# Patient Record
Sex: Male | Born: 1960 | Race: White | Hispanic: No | Marital: Single | Smoking: Never smoker
Health system: Southern US, Community
[De-identification: ages and names within clinical notes are randomized; demographics above are authoritative.]

## PROBLEM LIST (undated history)

## (undated) DIAGNOSIS — R1310 Dysphagia, unspecified: Secondary | ICD-10-CM

## (undated) DIAGNOSIS — Q909 Down syndrome, unspecified: Secondary | ICD-10-CM

## (undated) DIAGNOSIS — K219 Gastro-esophageal reflux disease without esophagitis: Secondary | ICD-10-CM

## (undated) DIAGNOSIS — K2210 Ulcer of esophagus without bleeding: Secondary | ICD-10-CM

## (undated) HISTORY — PX: NO PAST SURGERIES: SHX2092

---

## 2005-09-17 ENCOUNTER — Encounter: Admission: RE | Admit: 2005-09-17 | Discharge: 2005-09-17 | Payer: Self-pay | Admitting: Family Medicine

## 2006-08-08 ENCOUNTER — Inpatient Hospital Stay (HOSPITAL_COMMUNITY): Admission: EM | Admit: 2006-08-08 | Discharge: 2006-08-12 | Payer: Self-pay | Admitting: Emergency Medicine

## 2006-08-10 ENCOUNTER — Encounter (INDEPENDENT_AMBULATORY_CARE_PROVIDER_SITE_OTHER): Payer: Self-pay | Admitting: *Deleted

## 2006-08-10 DIAGNOSIS — K2210 Ulcer of esophagus without bleeding: Secondary | ICD-10-CM

## 2006-08-10 DIAGNOSIS — R1310 Dysphagia, unspecified: Secondary | ICD-10-CM

## 2006-08-10 HISTORY — DX: Dysphagia, unspecified: R13.10

## 2006-08-10 HISTORY — DX: Ulcer of esophagus without bleeding: K22.10

## 2008-11-11 ENCOUNTER — Encounter: Admission: RE | Admit: 2008-11-11 | Discharge: 2008-11-11 | Payer: Self-pay | Admitting: Family Medicine

## 2009-06-29 ENCOUNTER — Encounter: Admission: RE | Admit: 2009-06-29 | Discharge: 2009-06-29 | Payer: Self-pay | Admitting: Family Medicine

## 2010-10-05 NOTE — Consult Note (Signed)
[name], [name]           ACCOUNT NO.:  [id number]   MEDICAL RECORD NO.:  [id number]          PATIENT TYPE:  INP   LOCATION:  1401                         FACILITY:  [facility]   PHYSICIAN:  [name] Contras, MD     DATE OF BIRTH:  Feb 01, 1961   DATE OF CONSULTATION:  08/09/2006  DATE OF DISCHARGE:                                 CONSULTATION   REQUESTING SERVICE:  In [facility].   CHIEF COMPLAINT:  Vomiting with poor oral intake.   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  He had poor oral intake through the week and  his vomiting was described as projectile and regurgitating.  With any  attempt to eat or drink, he vomits it back up immediately.  He had some  low grade fever and chills about 3 days ago.  He has been biting his  arms more over the past week, which is typically a sign of pain.  Because of these symptoms, he was brought to the emergency room on August 07, 2006, where he was sedated for examination and testing.  He ended up  being overly sedated but did not require intubation.  Testing included a  CT scan of the head which was negative and a chest x-ray which showed  findings consistent with pneumonitis.  Abdominal x-ray was negative.  Lab testing included normal electrolytes and a normal white blood count.  Since being admitted, he has had no significant oral intake except  trying to sip a couple of sips of Sprite which he regurgitated.  His IV  has been pulled out and consultation was requested with an unclear  diagnosis at this time.   PAST MEDICAL HISTORY:  1. Down syndrome.  2. Mental retardation.  3. Constipation.  4. Allergies.  5. Dysphagia.  6. History of self mutilation.   MEDICATIONS:  Singulair, Constulose, Phenergan.   ALLERGIES:  1. PENICILLIN.  2. ACTIFED.  3. KEFLEX.   FAMILY HISTORY:  The patient's mother is 48 years  old and has Alzheimer  dementia.  His father is 15 years old and has bladder cancer.  He has 3  sisters who are relatively healthy except for one who has hypertension  and obesity.   SOCIAL HISTORY:  The patient lives as a resident at [facility], which is a group home.  He is predominantly dependent for most  of his ADLs.  He does not smoke and does not drink alcohol.  Evidently,  he is a fast eater.   REVIEW OF SYSTEMS:  Cannot be obtained because of mental status.   PHYSICAL EXAMINATION:  VITAL SIGNS:  Afebrile, vital signs stable.  GENERAL:  The patient is alert but not very cooperative.  ABILITY TO COMMUNICATE:  The patient is nonverbal and grunts at times.  HEAD/FACE:  The face is a Down facies.  There are no structural  abnormalities of the face and the head otherwise.  EYES:  Extraocular movements appear to be intact.  The pupils are  equally round.  NOSE:  External nose is normal.  The nasal passages are patent.  MOUTH:  The patient has poor dentition.  His mouth is examined with his  sisters holding him.  The tongue appears to be normal.  The oropharynx  shows no evidence of exudate or redness.  There is no swelling in the  oropharynx.  NECK:  The patient's neck is muscular.  There is no tenderness and no  mass.  LYMPHATICS:  There is no lymphadenopathy of the neck.  CRANIAL NERVES:  Difficult to assess due to his mental status.   RADIOLOGIC EXAMINATION:  A head CT on August 07, 2006, was personally  reviewed and showed normal sinuses.   ASSESSMENT:  The patient is a 50 year old white male with mental  retardation who has had a several day history of vomiting,  regurgitation, and poor oral intake.   PLAN:  The neck and oropharynx appear to be normal and the sinuses are  normal by CT.  It is possible that he has an esophageal obstruction due  to a food bolus.  Other esophageal conditions are possible as well.  A  rigid esophagoscopy would be risky given his  history of Down syndrome  and potential for cervical spine instability.  Thus, I discussed the  case with Dr. Evette Cristal who is a gastroenterologist, who has agreed to see  the patient for consideration of flexible endoscopy.  I will recommend  that an IV be started on the patient, so that he can be hydrated in the  meantime.      [name] Contras, MD  Electronically Signed     DDB/MEDQ  D:  08/09/2006  T:  08/09/2006  Job:  [id number]

## 2010-10-05 NOTE — Consult Note (Signed)
NAMELAVONTA, [name] NO.:  [id number]   MEDICAL RECORD NO.:  [id number]          PATIENT TYPE:  INP   LOCATION:  1401                         FACILITY:  [facility]   PHYSICIAN:  Graylin Shiver, M.D.   DATE OF BIRTH:  1961/03/12   DATE OF CONSULTATION:  08/09/2006  DATE OF DISCHARGE:                                 CONSULTATION   REASON FOR CONSULTATION:  The patient is a 50 year old white male with a  history of Down syndrome.  He was admitted to the hospital from the  group home that he lives in on August 07, 2006, because of persistent  vomiting.  The patient has been vomiting for about a week and has been  unable to keep anything down.  There is no history of hematemesis while  in the hospital.  His sister has been trying to give him something to  drink periodically.  He can drink some water; sometimes it stays down  for 30 minutes, but then comes back up; or some times it comes up  immediately.  Because of his persistent vomiting and refusal at this  time to put anything in his mouth, ENT was asked to see him.  Dr. Jenne Pane  came and saw him.  He did not find any abnormality in the oropharynx to  explain this problem.  We are asked, therefore, to further evaluate the  esophagus and upper GI tract for a possible source.   In reviewing his history, he was given sedation for a CT scan in the  emergency room when he came in.  He became extremely obtunded after the  sedation, with dropping of his oxygen saturations.   PAST HISTORY:  1. Down syndrome with profound mental retardation.  2. History of self-mutilation.   ALLERGIES:  PENICILLIN, ACTIFED, KEFLEX.   MEDICATIONS PRIOR TO ADMISSION:  1. Singulair.  2. __________  for constipation.  3. Phenergan.   SYSTEMS REVIEW:  Unobtainable from the patient.  The patient essentially  cannot give any history.   PHYSICAL EXAMINATION:  GENERAL:  He does not appear in any acute  distress.  HEENT:  He is nonicteric.  HEART:  Regular rhythm.  No murmurs heard.  LUNGS:  Clear.  ABDOMEN:  Soft, nontender.   IMPRESSION:  Persistent vomiting, which has being going on for about a  week.  There is also a question of dysphagia.  I wonder about also  odynophagia, since now he is  refusing to put anything in his mouth.   PLAN:  In light of his symptoms, he could have esophagitis, stricture or  even a foreign body in the esophagus.  I will plan to proceed with upper  endoscopy.  I will ask anesthesia for further assistance in sedation.           ______________________________  Graylin Shiver, M.D.     SFG/MEDQ  D:  08/09/2006  T:  08/09/2006  Job:  [id number]   cc:   Altha Harm, MD  Fax: [phone]   Antony Contras, MD  Fax: [phone]

## 2010-10-05 NOTE — H&P (Signed)
[name], [name]           ACCOUNT NO.:  [id number]   MEDICAL RECORD NO.:  [id number]          PATIENT TYPE:  INP   LOCATION:  1401                         FACILITY:  [facility]   PHYSICIAN:  Elliot Cousin, M.D.    DATE OF BIRTH:  [birth year]   DATE OF ADMISSION:  08/07/2006  DATE OF DISCHARGE:                              HISTORY & PHYSICAL   CHIEF COMPLAINT:  Intractable nausea and vomiting.   HISTORY OF PRESENT ILLNESS:  The patient is a 50 year old man with a  past medical history significant for Downs syndrome and profound mental  retardation.  He is a resident of [facility], a group home.  He was brought to the emergency department at [facility] late  last night for intractable nausea and vomiting.  The patient is  currently sedated and is unable to provide any history.  Because of his  profound mental retardation, he is virtually non verbal chronically.  The history is provided by an assistant at the group home, Gene Swaziland,  and the patient's sister, [name].  Accordingly, over the  past 3-4 days, the patient has had intractable nausea and vomiting.  At  times he has had projective vomiting and regurgitation.  According to  Mr. Swaziland, over the past 24 hours the patient has been unable to keep  any foods and liquids down at all.  As soon as he attempts to eat or  drink, he vomits them back up immediately.  The patient has also had  some chest congestion and a productive and intermittently dry cough.  When his cough is productive, the sputum has been white and occasionally  off-colored.  There was an episode of fevers and chills 3 days ago.  It  is unclear whether or not the patient has had abdominal pain.  However,  according to Mr. Swaziland and Ms. Ramp, when the patient has any type  of discomfort he self mutilates by biting his arm and also by thrashing  his head against the bed or a wall.  He has had some episodes of biting  of his  right greater than his left arms over the past few days.  He has  not had diarrhea.  No obvious pain with urination, although Mr. Swaziland  notes that his urine output has been very little over the past 2 days.  He has had no coffee ground emesis or bright red blood in his emesis.  No bright red blood per rectum and no black, tarry stools.   During the initial evaluation in the emergency department by the  physician and PA, the patient was noted to be very agitated and  obtaining lab work, CT scan of the head, and a chest x-ray were  virtually impossible.  The patient was therefore given a total of 20 mg  of Geodon IM, 2 mg of Dilaudid, 4 mg of Zofran, and 2 mg of Ativan.  These medications were given over the course of 2 hours.  The patient  eventually became sedated and the evaluation was able to go forth.  However, following the administration of the  medications, the patient  became virtually obtunded.  The oxygen saturation transiently fell into  the 50s to 80s on 2 liters of nasal cannula oxygen.  The oxygen  supplementation was titrated upward.  Now several hours later, the  patient is oxygenating between 95 and 98% on 2 liters of nasal cannula  oxygen.  He is currently hemodynamically stable, although he is still  somewhat obtunded.  The patient is protecting his airway.  He will be  admitted for further evaluation and management.   PAST MEDICAL HISTORY:  1. Downs syndrome with profound mental retardation.  2. History of self-mutilation.  3. Constipation.  4. Seasonal allergies.  5. Mild dysphagia.  6. MEDICATION ALLERGIES TO PENICILLIN, ACTIFED, AND KEFLEX.   MEDICATIONS:  1. Singulair 10 mg daily.  2. Constulose 10 gm/15 mL, 30 mL b.i.d.  3. Phenergan 25 mg IM q.6 h p.r.n. nausea and vomiting.  4. Regular mechanical chopped food, staff to assist as needed.   ALLERGIES:  THE PATIENT HAS ALLERGIES TO KEFLEX, ACTIFED, AND  PENICILLIN.   SOCIAL HISTORY:  The patient is a  resident of [facility]  (group home).  He is single with no children.  He is predominantly  dependent on most of his ADL's.  He needs a small amount of assistance  with eating.  He ambulates only with assistance.  No history of alcohol,  tobacco or illicit drug use.  His sister, Dale Ribeiro, is his legal  guardian.   FAMILY HISTORY:  His mother is a 10 years of age and has Alzheimer's  dementia.  His father is 38 years of age and has bladder cancer.  He has  3 sisters who are relatively healthy with the exception of one who has  hypertension and obesity.   REVIEW OF SYSTEMS:  As above in the History of Present Illness.   EXAM:  VITAL SIGNS: Temperature 98.4, blood pressure 103/69, pulse 120,  oxygen saturation 96% on 2 liters of nasal cannula oxygen.  Respiratory  rate 16.  IN GENERAL: The patient is a small framed 50 year old Caucasian man who  is currently lying in bed obtunded.  HEENT: His head is normocephalic.  There is a small area of ecchymosis  and a small nodule over the right temple which is mildly erythematous  with no obvious tenderness (according to Ms. Cowdrey this is the area  that the patient thrashes his head against the wall or bed).  Pupils are  equal and round and minimally reactive to light.  Extraocular movements  are intact.  Conjunctivae are clear.  Sclerae are white.  Nasal mucosa  is dry, no sinus tenderness.  Oropharynx reveals a few missing teeth and  fair dentition.  Mucous membranes are dry.  No posterior exudates or  erythema.  NECK: Supple, no adenopathy, no thyromegaly, no bruit, no JVD.  LUNGS: A few bilateral crackles and occasional wheezes are auscultated  bilaterally with decreased breath sounds in the bases.  Breathing  appears to be nonlabored.  HEART: S1/S2 with a soft systolic murmur and tachycardia.  ABDOMEN: Hypoactive bowel sounds, soft, nontender, nondistended, no hepatosplenomegaly, no masses palpated.  GU AND RECTAL:  Deferred.  EXTREMITIES: Pedal pulses palpable bilaterally.  No pedal edema and no  pretibial edema.  His right arm has multiple bite marks as evidenced by  excoriations; no active bleeding, purulent drainage but with a small  amount of erythema.  NEUROLOGIC: The patient is obtunded and minimally responsive to stimuli.  According  to the registered nurse the patient was moving all of his  extremities and alert prior to medications given.   ADMISSION LABORATORIES:  Chest x-ray reveals bronchitis versus  interstitial pneumonitis.  CT scan of the head reveals no acute  intracranial findings.  Abdominal x-ray reveals no acute abdominal  process.  Myoglobin 318, CK-MB 8.2, troponin-I less than 0.05, sodium  147, potassium 3.7, chloride 110, carbon dioxide 19, glucose 108, BUN  15, creatinine 1.22, calcium 9.2, total protein 7.7, albumin 3.7, AST  26, ALT 17, lipase 21.  Urinalysis moderate bilirubin, greater than 80  ketones, 30 protein, negative leukocyte esterase.   ASSESSMENT:  1. Intractable nausea and vomiting.  The etiology is unclear.      However, the patient may have a viral versus bacterial      gastroenteritis.  During his stay in the emergency department,      there was no evidence of nausea and vomiting.  Of note, his lipase      and LFTs are within normal limits.  His urinalysis is not      consistent with pyelonephritis or an urinary tract infection.  2. Reported agitation in the emergency department.  Now the patient      has obtundation status post administration of Geodon, Dilaudid,      Zofran, and Ativan.  3. Transient hypoxia secondary to mild respiratory depression.  The      patient is now saturating between 95 and 98% on 2 liters of nasal      cannula oxygen and he has been doing so for the past 1-2 hours.  He      is currently protecting his airway.  4. Bronchitis versus pneumonitis on the chest x-ray.  Given the      patient's medical history, he may have an  element of aspiration      pneumonitis.  He is however afebrile and without a leukocytosis.      Given his recent history, however, it is reasonable to start      empiric antibiotics.  5. Dehydration/hypernatremia.  The patient's serum sodium is 147.      This is consistent with recent nausea and vomiting and poor p.o.      intake.   PLAN:  1. The patient will be admitted for further evaluation and management.  2. Continue oxygen supplementation titrated to keep his oxygen      saturations greater than 90%.  3. Neuro checks every 4 hours x24 hours.  4. IV fluid volume repletion with normal saline.  5. Cleocin was given by the emergency department physician.  Will      continue antibiotic treatment with Levaquin 500 mg IV daily.  6. Pulmonary toilet per the respiratory therapist.  Will start Xopenex      nebulizer every 6 hours, then every 2 hours p.r.n. 7. We will check an ultrasound of the abdomen for further evaluation      of nausea and vomiting.  8. The patient is currently a full code per Ms. Greggory Brandy.      Elliot Cousin, M.D.  Electronically Signed     DF/MEDQ  D:  08/08/2006  T:  08/08/2006  Job:  [id number]

## 2010-10-05 NOTE — Discharge Summary (Signed)
[name], [name]           ACCOUNT NO.:  [id number]   MEDICAL RECORD NO.:  [id number]          PATIENT TYPE:  INP   LOCATION:  1401                         [facility]ILITY[facility]   PHYSICIAN:  Altha Harm, MDDATE OF BIRTH:  01/23/1961   DATE OF ADMISSION:  08/07/2006  DATE OF DISCHARGE:  08/12/2006                               DISCHARGE SUMMARY   DISCHARGE DISPOSITION:  Home.   FINAL DISCHARGE DIAGNOSES:  1. Esophageal ulcers.  2. Mild dehydration.  3. History of dysphagia.  4. History of allergic rhinitis.  5. History of self-mutilation.  6. Down syndrome with profound mental retardation.  7. History of constipation.   CONSULTANTS:  Gastroenterology.   PROCEDURE:  EGD which showed esophageal ulcers.   CODE STATUS:  Full code.   ALLERGIES:  PENICILLIN, AMPICILLIN, KEFLEX.   DISCHARGE MEDICATIONS:  1. Protonix 40 mg p.o. daily.  2. Carafate 1 g p.o. a.c. and h.s.  3. Singulair 10 mg p.o. daily.  4. Phenergan 25 mg p.o. q.6h. p.r.n.  5. Lactulose 30 mL p.o. b.i.d.  6. Halcion 0.2 mg p.o. prior to dental work.   CHIEF COMPLAINT:  Refusal to eat and spitting of food.   HISTORY OF PRESENT ILLNESS:  Please see H&P dictated by Dr. Sherrie Mustache for  details of the HPI.   HOSPITAL COURSE:  Esophageal ulcers and poor oral intake.  The patient  was observed for approximately 24 hours while on IV fluids, and it was  noted that the patient was in fact not having any emesis but rather was  spitting up any food offered to him and had refused to take anything  orally.  The patient is nonverbal and cannot verbally express his pain  but indications by family members and group home personnel who knew him  best suggested that this was an indication that the patient was having  pain.  Consultation was made to ENT for him to have an examination of  the throat.  However, this examination did not reveal any pathology, and  thus gastroenterology was consulted, and the patient had an  EGD done  that showed esophageal ulcers.  The patient was placed on Protonix  b.i.d. and, after 24 hours of being on the IV Protonix, the patient  began to accept food.  The patient, at this time, has been ingesting  some liquids and, in particular, ice-cream.  The patient has taken in  enough fluid to be able to maintain his hydration.  The patient should  be on a low fat diet, avoiding fried foods and no spicy foods.  I would  initially recommend a soft diet for the patient until he is not showing  any indications of pain and then advance the diet to what he regularly  eats.  All other medical problems remained stable while hospitalized,  and the patient is being discharged on the above-stated medications.   FOLLOWUP:  The patient is to follow up in the gastroenterology clinic in  2-3 weeks with Dr. Bosie [name], phone number [phone].   PHYSICAL RESTRICTIONS:  None.   LABORATORY FOLLOWUP:  None.      [name]  [name] Garnet, MD  Electronically Signed     MAM/MEDQ  D:  08/12/2006  T:  08/12/2006  Job:  [id number]   cc:   [name] Friar, MD  Fax: [phone]

## 2010-10-05 NOTE — Op Note (Signed)
[name], [name]           ACCOUNT NO.:  [id number]   MEDICAL RECORD NO.:  [id number]          PATIENT TYPE:  INP   LOCATION:  1401                         FACILITY:  [facility]   PHYSICIAN:  Graylin Shiver, M.D.   DATE OF BIRTH:  [birth year]   DATE OF PROCEDURE:  08/10/2006  DATE OF DISCHARGE:                               OPERATIVE REPORT   OPERATION/PROCEDURE:  Esophagogastroduodenoscopy.   INDICATIONS FOR PROCEDURE:  This is a 50 year old patient with Down  syndrome, unable to swallow.  He used to eat without difficulty and  drink without difficulty, but all of a sudden started to refuse to eat  or drink.  The family felt that it was because he was having pain and  difficulty swallowing.  Informed consent was obtained after explanation of the risks of  bleeding, infection and perforation from the family.   PROCEDURE:  The patient was anesthetized by the anesthesiologist.   With the patient in the left lateral decubitus position, the gastroscope  was inserted into the oropharynx and passed into the esophagus.  It was  advanced down the esophagus,  then into the stomach, then into the  duodenum.  The second portion and bulb of the duodenum looked normal.  The stomach looked normal.  There was a linear ulcer at 35 cm in the  esophagus and two ulcers at 32 cm in the esophagus.  These were  biopsied.  There was some exudate in the esophagus.  I am not sure pf  this was candida or just some mucous.  He tolerated the procedure well  without complications.   IMPRESSION:  1. Linear ulcer in the esophagus at 35 cm.  2. Two ulcers at 32 cm in the esophagus.  3. Exudate in the esophagus, rule out Candida.   PLAN:  Check biopsies.           ______________________________  Graylin Shiver, M.D.     SFG/MEDQ  D:  08/11/2006  T:  08/12/2006  Job:  [id number]   cc:   [facility]

## 2014-07-30 ENCOUNTER — Emergency Department (HOSPITAL_COMMUNITY)
Admission: EM | Admit: 2014-07-30 | Discharge: 2014-07-30 | Disposition: A | Discharge status: Home / Self Care | Payer: Medicare Other | Attending: Emergency Medicine | Admitting: Emergency Medicine

## 2014-07-30 ENCOUNTER — Encounter (HOSPITAL_COMMUNITY): Payer: Self-pay | Admitting: Emergency Medicine

## 2014-07-30 DIAGNOSIS — Q909 Down syndrome, unspecified: Secondary | ICD-10-CM | POA: Insufficient documentation

## 2014-07-30 DIAGNOSIS — Z79899 Other long term (current) drug therapy: Secondary | ICD-10-CM | POA: Diagnosis not present

## 2014-07-30 DIAGNOSIS — K2971 Gastritis, unspecified, with bleeding: Secondary | ICD-10-CM | POA: Insufficient documentation

## 2014-07-30 DIAGNOSIS — K921 Melena: Secondary | ICD-10-CM | POA: Diagnosis present

## 2014-07-30 DIAGNOSIS — Z880 Allergy status to penicillin: Secondary | ICD-10-CM | POA: Diagnosis not present

## 2014-07-30 DIAGNOSIS — K922 Gastrointestinal hemorrhage, unspecified: Secondary | ICD-10-CM

## 2014-07-30 HISTORY — DX: Down syndrome, unspecified: Q90.9

## 2014-07-30 HISTORY — DX: Dysphagia, unspecified: R13.10

## 2014-07-30 HISTORY — DX: Ulcer of esophagus without bleeding: K22.10

## 2014-07-30 LAB — BASIC METABOLIC PANEL
ANION GAP: 9 (ref 5–15)
BUN: 9 mg/dL (ref 6–23)
CHLORIDE: 107 mmol/L (ref 96–112)
CO2: 23 mmol/L (ref 19–32)
Calcium: 8.3 mg/dL — ABNORMAL LOW (ref 8.4–10.5)
Creatinine, Ser: 0.76 mg/dL (ref 0.50–1.35)
GFR calc Af Amer: >90 mL/min (ref >90)
Glucose, Bld: 90 mg/dL (ref 70–99)
POTASSIUM: 4.1 mmol/L (ref 3.5–5.1)
SODIUM: 139 mmol/L (ref 135–145)

## 2014-07-30 LAB — CBC WITH DIFFERENTIAL/PLATELET
BASOS PCT: 1 % (ref 0–1)
Basophils Absolute: 0.1 10*3/uL (ref 0.0–0.1)
EOS ABS: 0.1 10*3/uL (ref 0.0–0.7)
EOS PCT: 2 % (ref 0–5)
HEMATOCRIT: 38 % — AB (ref 39.0–52.0)
HEMOGLOBIN: 12.7 g/dL — AB (ref 13.0–17.0)
LYMPHS ABS: 1.3 10*3/uL (ref 0.7–4.0)
LYMPHS PCT: 16 % (ref 12–46)
MCH: 32.7 pg (ref 26.0–34.0)
MCHC: 33.4 g/dL (ref 30.0–36.0)
MCV: 97.9 fL (ref 78.0–100.0)
MONO ABS: 1 10*3/uL (ref 0.1–1.0)
MONOS PCT: 12 % (ref 3–12)
NEUTROS PCT: 69 % (ref 43–77)
Neutro Abs: 5.7 10*3/uL (ref 1.7–7.7)
PLATELETS: 290 10*3/uL (ref 150–400)
RBC: 3.88 MIL/uL — AB (ref 4.22–5.81)
RDW: 13.2 % (ref 11.5–15.5)
WBC: 8.2 10*3/uL (ref 4.0–10.5)

## 2014-07-30 LAB — POC OCCULT BLOOD, ED: Fecal Occult Bld: POSITIVE — AB

## 2014-07-30 NOTE — ED Provider Notes (Signed)
CSN: [id number]     Arrival date & time 07/30/14  1718 History   First MD Initiated Contact with Patient 07/30/14 1740     Chief Complaint  Patient presents with  . decreased appetite   . Melena  . Malodorous urine       HPI Pt brought here by group home caregiver. Was seen by MD Katina Dung and referred here. Per group home patient has had strange foul odor in urine x3 days. Had black tarry stool starting today. Per home, patient has not been eating or drinking fluids-only cranberry juice and no water. MD requests UA with C&S, would like fecal occult blood completed and labs drawn to rule out possible GI bleed. No other c/c. Patient is rolling around on the floor and grunting. Group home manager says this is his baseline. Past Medical History  Diagnosis Date  . Down syndrome   . Esophageal ulcer   . Dysphagia    History reviewed. No pertinent past surgical history. History reviewed. No pertinent family history. History  Substance Use Topics  . Smoking status: Never Smoker   . Smokeless tobacco: Not on file  . Alcohol Use: No    Review of Systems  Unable to perform ROS     Allergies  Actifed cold-allergy; Amoxicillin; Keflex; and Penicillins  Home Medications   Prior to Admission medications   Medication Sig Start Date End Date Taking? Authorizing Provider  montelukast (SINGULAIR) 10 MG tablet Take 10 mg by mouth daily.   Yes Historical Provider, MD  pantoprazole (PROTONIX) 20 MG tablet Take 20 mg by mouth daily.   Yes Historical Provider, MD  triazolam (HALCION) 0.125 MG tablet Take 0.375 mg by mouth at bedtime as needed for sleep (prior to dental procedure).   Yes Historical Provider, MD   BP 139/70 mmHg  Pulse 79  Temp(Src) 97.9 F (36.6 C) (Axillary)  Resp 18  SpO2 95% Physical Exam  Constitutional: He appears well-nourished. No distress.  HENT:  Head: Normocephalic and atraumatic.  Eyes: Conjunctivae are normal.  Neck: Normal range of motion.   Cardiovascular: Normal rate.   Pulmonary/Chest: No respiratory distress.  Abdominal: Normal appearance. He exhibits no distension.  Neurological: He is alert. No cranial nerve deficit.  Skin: Skin is warm and dry. No rash noted.  Nursing note and vitals reviewed.   ED Course  Procedures (including critical care time) Labs Review Labs Reviewed  CBC WITH DIFFERENTIAL/PLATELET - Abnormal; Notable for the following:    RBC 3.88 (*)    Hemoglobin 12.7 (*)    HCT 38.0 (*)    All other components within normal limits  BASIC METABOLIC PANEL - Abnormal; Notable for the following:    Calcium 8.3 (*)    All other components within normal limits  POC OCCULT BLOOD, ED - Abnormal; Notable for the following:    Fecal Occult Bld POSITIVE (*)    All other components within normal limits  URINE CULTURE  URINALYSIS, ROUTINE W REFLEX MICROSCOPIC    Imaging Review No results found.  I discussed the case with Dr. Leary Roca from gastroenterology.  Patient appears stable for discharge and follow-up can be arranged next week through the office.  I spoke with Dr. Dayna Barker who was the physician at the group home.  I discussed to him my conversation with Dr. Ewing Schlein and he will follow-up next week as directed..  They can bring a urine back for further evaluation.  Patient stable for discharge.  MDM   Final  diagnoses:  Gastrointestinal hemorrhage, unspecified gastritis, unspecified gastrointestinal hemorrhage type     Nelva Nayobert Carie Kapuscinski, MD 07/30/14 2015

## 2014-07-30 NOTE — ED Notes (Signed)
Attending does not wish to do a cath for UA.

## 2014-07-30 NOTE — ED Notes (Signed)
Gave caregiver a urinal for the patient to see if he could give a urine sample.

## 2014-07-30 NOTE — ED Notes (Signed)
Pt brought here by group home caregiver. Was seen by MD Katina DungHoover [name] and referred here. Per group home patient has had strange foul odor in urine x3 days. Had black tarry stool starting today. Per home, patient has not been eating or drinking fluids-only cranberry juice and no water. MD requests UA with C&S, would like fecal occult blood completed and labs drawn to rule out possible GI bleed. No other c/c. Patient is rolling around on the floor and grunting. Group home manager says this is his baseline.

## 2014-07-30 NOTE — ED Notes (Signed)
Pt is sitting comfortably on the floor in NAD.  Pt's caregiver relays to this writer that pt, "just wants to go home."

## 2014-07-30 NOTE — ED Notes (Addendum)
Pt was unable to give a urine sample with help of the caregiver. Will notify RN.

## 2014-07-30 NOTE — ED Notes (Signed)
Cup of Apple juice given.

## 2014-07-30 NOTE — ED Notes (Signed)
Phlebotomy at bedside.

## 2014-07-30 NOTE — Discharge Instructions (Signed)

## 2014-08-03 ENCOUNTER — Emergency Department (HOSPITAL_COMMUNITY): Payer: Medicare Other

## 2014-08-03 ENCOUNTER — Emergency Department (HOSPITAL_COMMUNITY)
Admission: EM | Admit: 2014-08-03 | Discharge: 2014-08-03 | Disposition: A | Discharge status: Home / Self Care | Payer: Medicare Other | Attending: Emergency Medicine | Admitting: Emergency Medicine

## 2014-08-03 ENCOUNTER — Encounter (HOSPITAL_COMMUNITY): Payer: Self-pay | Admitting: *Deleted

## 2014-08-03 DIAGNOSIS — Z880 Allergy status to penicillin: Secondary | ICD-10-CM | POA: Diagnosis not present

## 2014-08-03 DIAGNOSIS — Q909 Down syndrome, unspecified: Secondary | ICD-10-CM | POA: Insufficient documentation

## 2014-08-03 DIAGNOSIS — Z8719 Personal history of other diseases of the digestive system: Secondary | ICD-10-CM | POA: Diagnosis not present

## 2014-08-03 DIAGNOSIS — R109 Unspecified abdominal pain: Secondary | ICD-10-CM | POA: Diagnosis not present

## 2014-08-03 DIAGNOSIS — M79604 Pain in right leg: Secondary | ICD-10-CM | POA: Diagnosis present

## 2014-08-03 DIAGNOSIS — Z79899 Other long term (current) drug therapy: Secondary | ICD-10-CM | POA: Diagnosis not present

## 2014-08-03 LAB — CBC WITH DIFFERENTIAL/PLATELET
BASOS ABS: 0.1 10*3/uL (ref 0.0–0.1)
Basophils Relative: 1 % (ref 0–1)
Eosinophils Absolute: 0.1 10*3/uL (ref 0.0–0.7)
Eosinophils Relative: 1 % (ref 0–5)
HEMATOCRIT: 42.8 % (ref 39.0–52.0)
Hemoglobin: 14.4 g/dL (ref 13.0–17.0)
LYMPHS PCT: 13 % (ref 12–46)
Lymphs Abs: 0.9 10*3/uL (ref 0.7–4.0)
MCH: 32.7 pg (ref 26.0–34.0)
MCHC: 33.6 g/dL (ref 30.0–36.0)
MCV: 97.1 fL (ref 78.0–100.0)
MONO ABS: 0.8 10*3/uL (ref 0.1–1.0)
Monocytes Relative: 11 % (ref 3–12)
Neutro Abs: 5.2 10*3/uL (ref 1.7–7.7)
Neutrophils Relative %: 74 % (ref 43–77)
PLATELETS: 342 10*3/uL (ref 150–400)
RBC: 4.41 MIL/uL (ref 4.22–5.81)
RDW: 13 % (ref 11.5–15.5)
WBC: 7 10*3/uL (ref 4.0–10.5)

## 2014-08-03 LAB — COMPREHENSIVE METABOLIC PANEL
ALK PHOS: 65 U/L (ref 39–117)
ALT: 14 U/L (ref 0–53)
AST: 31 U/L (ref 0–37)
Albumin: 3.3 g/dL — ABNORMAL LOW (ref 3.5–5.2)
Anion gap: 7 (ref 5–15)
BILIRUBIN TOTAL: 0.4 mg/dL (ref 0.3–1.2)
BUN: 10 mg/dL (ref 6–23)
CHLORIDE: 104 mmol/L (ref 96–112)
CO2: 28 mmol/L (ref 19–32)
Calcium: 8.7 mg/dL (ref 8.4–10.5)
Creatinine, Ser: 0.73 mg/dL (ref 0.50–1.35)
GFR calc Af Amer: >90 mL/min (ref >90)
GLUCOSE: 107 mg/dL — AB (ref 70–99)
POTASSIUM: 3.9 mmol/L (ref 3.5–5.1)
SODIUM: 139 mmol/L (ref 135–145)
Total Protein: 7.4 g/dL (ref 6.0–8.3)

## 2014-08-03 LAB — LIPASE, BLOOD: Lipase: 21 U/L (ref 11–59)

## 2014-08-03 MED ORDER — TRIAZOLAM 0.125 MG PO TABS
0.3750 mg | ORAL_TABLET | ORAL | Status: AC
  Administered 2014-08-03: 0.375 mg via ORAL
  Filled 2014-08-03: qty 3

## 2014-08-03 MED ORDER — LORAZEPAM 1 MG PO TABS
1.0000 mg | ORAL_TABLET | Freq: Once | ORAL | Status: AC
  Administered 2014-08-03: 1 mg via ORAL
  Filled 2014-08-03: qty 1

## 2014-08-03 MED ORDER — TRIAZOLAM 0.125 MG PO TABS
0.3750 mg | ORAL_TABLET | ORAL | Status: DC
  Filled 2014-08-03: qty 1

## 2014-08-03 NOTE — ED Provider Notes (Signed)
CSN: [id number]     Arrival date & time 08/03/14  0849 History   First MD Initiated Contact with Patient 08/03/14 0902     Chief Complaint  Patient presents with  . Leg Pain     (Consider location/radiation/quality/duration/timing/severity/associated sxs/prior Treatment) HPI  54 year old male with a prior history of Down syndrome presents with right leg pain and "weakness" since yesterday. The history is taken from one of the caregivers. Last night he was not able to walk on it at all. When he got here he is able to ambulate a little bit. Due to his Down syndrome and is tough to tell if this is weakness or pain. The patient was doubled over last night with what they think was abdominal pain. He was seen in the ER 4 days ago for black stools. This is been improving. He saw the GI doctor in follow-up. No vomiting. No fevers. No right arm or left-sided symptoms.  Past Medical History  Diagnosis Date  . Down syndrome   . Esophageal ulcer   . Dysphagia    History reviewed. No pertinent past surgical history. No family history on file. History  Substance Use Topics  . Smoking status: Never Smoker   . Smokeless tobacco: Not on file  . Alcohol Use: No    Review of Systems  Unable to perform ROS: Patient nonverbal      Allergies  Actifed cold-allergy; Amoxicillin; Keflex; and Penicillins  Home Medications   Prior to Admission medications   Medication Sig Start Date End Date Taking? Authorizing Provider  montelukast (SINGULAIR) 10 MG tablet Take 10 mg by mouth daily.    Historical Provider, MD  pantoprazole (PROTONIX) 20 MG tablet Take 20 mg by mouth daily.    Historical Provider, MD  triazolam (HALCION) 0.125 MG tablet Take 0.375 mg by mouth at bedtime as needed for sleep (prior to dental procedure).    Historical Provider, MD   BP 147/109 mmHg  Pulse 98  Resp 21  SpO2 100% Physical Exam  Constitutional: He appears well-developed and well-nourished.  HENT:  Head:  Normocephalic and atraumatic.  Right Ear: External ear normal.  Left Ear: External ear normal.  Nose: Nose normal.  Eyes: Right eye exhibits no discharge. Left eye exhibits no discharge.  Neck: Neck supple.  Cardiovascular: Normal rate, regular rhythm, normal heart sounds and intact distal pulses.   Pulmonary/Chest: Effort normal and breath sounds normal.  Abdominal: Soft. He exhibits no distension. There is no tenderness.  Musculoskeletal: He exhibits no edema.  Patient is difficult to examine but seems to limp on RLE when walking  Neurological: He is alert.  Equal strength in arms bilaterally. Does not participate in leg strength testing  Skin: Skin is warm and dry.  Nursing note and vitals reviewed.   ED Course  Procedures (including critical care time) Labs Review Labs Reviewed  COMPREHENSIVE METABOLIC PANEL - Abnormal; Notable for the following:    Glucose, Bld 107 (*)    Albumin 3.3 (*)    All other components within normal limits  CBC WITH DIFFERENTIAL/PLATELET  LIPASE, BLOOD  URINALYSIS, ROUTINE W REFLEX MICROSCOPIC    Imaging Review Ct Abdomen Pelvis Wo Contrast  08/03/2014   CLINICAL DATA:  Right leg pain and weakness. Difficulty with ambulation  EXAM: CT ABDOMEN AND PELVIS WITHOUT CONTRAST  TECHNIQUE: Multidetector CT imaging of the abdomen and pelvis was performed following the standard protocol without IV contrast.  COMPARISON:  Bilateral hip radiographs - 06/29/2009  FINDINGS: The lack  of intravenous contrast limits the ability to evaluate solid abdominal organs.  Normal hepatic contour. Normal noncontrast appearance of the gallbladder given patient respiration. No radiopaque gallstones. No ascites.  Normal noncontrast appearance of the bilateral kidneys. No radiopaque renal stones. No renal stones are seen along the expected course of either ureter or the urinary bladder. There is mild diffuse thickening of the urinary bladder wall, potentially accentuated due to  underdistention. Several phleboliths are seen within the lower pelvis bilaterally. No urinary obstruction or perinephric stranding.  Normal appearance of the bilateral adrenal glands, pancreas and spleen however the splenic flexure of the colon is noted to be interposed posterior to the spleen.  The bowel appears otherwise normal in course and caliber without wall thickening. Normal noncontrast appearance of the appendix. No pneumoperitoneum, pneumatosis or portal venous gas.  Normal caliber the abdominal aorta. No bulky retroperitoneal, mesenteric, pelvic or inguinal lymphadenopathy on this noncontrast examination. Prostatic calcifications. No free fluid in the pelvic cul-de-sac.  Limited visualization of lower thorax is degraded secondary to patient respiratory artifact. There ill-defined heterogeneous nodular airspace opacities within the imaged caudal aspects of the right middle lobe and lingula (image 6, series 6). No pleural effusion.  Normal heart size.  No pericardial effusion.  Age-indeterminate though presumably chronic mild (under 25%) compression deformity involving the superior endplate of the L2 vertebral body without associated retropulsion.  No acute or aggressive osseous abnormalities with special attention paid to the right hip and imaged portions of the right femur. No evidence of avascular necrosis. Right hip joint spaces are preserved.  Regional soft tissues appear normal.  IMPRESSION: 1. Age-indeterminate though presumably chronic mild (under 25%) compression deformity involving the superior endplate of the L2 vertebral body. Correlation for point tenderness at this location is recommended. 2. Otherwise, no definitive acute osseous abnormalities with special attention paid to the right hip. 3. Ill-defined heterogeneous slightly nodular airspace opacities within the imaged caudal aspects of the right middle lobe and lingula, incompletely evaluated and while potentially atelectasis or scar,  multifocal infection and/or aspiration could have a similar appearance. Clinical correlation is advised. 4. Mild left circumferential thickening the of urinary bladder wall, possibly accentuated due to underdistention though cystitis could have this similar appearance. Correlation with urinalysis is recommended   Electronically Signed   By: Simonne ComeJohn  Watts M.D.   On: 08/03/2014 13:44     EKG Interpretation None      MDM   Final diagnoses:  Right leg pain    Patient initially given ativan 1 mg po without change in mental status. Then given his triazolam which sedated him to allow blood samples and CT without contrast. Hgb improved from a couple days ago. No more bloody stools per primary care giver Judeen HammansRhonda English. She things it is inguinal or abdominal given his symptoms. Unable to get good exam there because patient doesn't allow, but CT is unremarkable and shows no obvious hernia or appendicitis. Labwork benign. No tenderness over L2 and this is likely a chronic finding. Caregiver notes he is laying on abdomen without issue and now moving leg freely like he has no pain. Unable to get urine, but she has a UA and urine culture pending and prefers to wait on this. Given he is not ill appearing, has normal labs and no fever or vomiting I feel this is reasonable. Discussed strict return precautions.    Pricilla LovelessScott Oneda Duffett, MD 08/03/14 [phone]

## 2014-08-03 NOTE — ED Notes (Signed)
Pt stood up to walk with MD and caregiver. Pt walked about 3 steps and per caregiver is not normal. Caregiver states pt is normally walking independently.

## 2014-08-03 NOTE — ED Notes (Signed)
Per group home staff, patient is complaining of right leg pain/weakness.  Caregiver states yesterday patient had some abdominal pain, but that seems to have resolved.

## 2014-08-03 NOTE — ED Notes (Signed)
Pt resting comfortably, respiration unlabored and even, Caregiver in room with pt.

## 2014-08-13 ENCOUNTER — Emergency Department (HOSPITAL_COMMUNITY): Payer: Medicare Other

## 2014-08-13 ENCOUNTER — Encounter (HOSPITAL_COMMUNITY): Payer: Self-pay | Admitting: Emergency Medicine

## 2014-08-13 ENCOUNTER — Emergency Department (HOSPITAL_COMMUNITY)
Admission: EM | Admit: 2014-08-13 | Discharge: 2014-08-13 | Disposition: A | Discharge status: Home / Self Care | Payer: Medicare Other | Attending: Emergency Medicine | Admitting: Emergency Medicine

## 2014-08-13 DIAGNOSIS — Y9289 Other specified places as the place of occurrence of the external cause: Secondary | ICD-10-CM | POA: Diagnosis not present

## 2014-08-13 DIAGNOSIS — Q909 Down syndrome, unspecified: Secondary | ICD-10-CM | POA: Diagnosis not present

## 2014-08-13 DIAGNOSIS — Z8719 Personal history of other diseases of the digestive system: Secondary | ICD-10-CM | POA: Diagnosis not present

## 2014-08-13 DIAGNOSIS — Z79899 Other long term (current) drug therapy: Secondary | ICD-10-CM | POA: Diagnosis not present

## 2014-08-13 DIAGNOSIS — M79603 Pain in arm, unspecified: Secondary | ICD-10-CM

## 2014-08-13 DIAGNOSIS — S52202A Unspecified fracture of shaft of left ulna, initial encounter for closed fracture: Secondary | ICD-10-CM

## 2014-08-13 DIAGNOSIS — Y9389 Activity, other specified: Secondary | ICD-10-CM | POA: Diagnosis not present

## 2014-08-13 DIAGNOSIS — W19XXXA Unspecified fall, initial encounter: Secondary | ICD-10-CM | POA: Diagnosis not present

## 2014-08-13 DIAGNOSIS — M25432 Effusion, left wrist: Secondary | ICD-10-CM | POA: Insufficient documentation

## 2014-08-13 DIAGNOSIS — Y998 Other external cause status: Secondary | ICD-10-CM | POA: Diagnosis not present

## 2014-08-13 DIAGNOSIS — Z880 Allergy status to penicillin: Secondary | ICD-10-CM | POA: Diagnosis not present

## 2014-08-13 DIAGNOSIS — M25529 Pain in unspecified elbow: Secondary | ICD-10-CM

## 2014-08-13 DIAGNOSIS — S52692A Other fracture of lower end of left ulna, initial encounter for closed fracture: Secondary | ICD-10-CM | POA: Insufficient documentation

## 2014-08-13 DIAGNOSIS — S59902A Unspecified injury of left elbow, initial encounter: Secondary | ICD-10-CM | POA: Diagnosis present

## 2014-08-13 MED ORDER — LORAZEPAM 1 MG PO TABS
1.0000 mg | ORAL_TABLET | Freq: Once | ORAL | Status: AC
  Administered 2014-08-13: 1 mg via ORAL
  Filled 2014-08-13: qty 1

## 2014-08-13 MED ORDER — HYDROCODONE-ACETAMINOPHEN 5-325 MG PO TABS: 1.0000 | ORAL_TABLET | Freq: Two times a day (BID) | ORAL | Status: DC | PRN

## 2014-08-13 MED ORDER — HYDROCODONE-ACETAMINOPHEN 5-325 MG PO TABS: 1.0000 | ORAL_TABLET | Freq: Once | ORAL | Status: DC

## 2014-08-13 MED ORDER — HYDROCODONE-ACETAMINOPHEN 5-325 MG PO TABS
1.0000 | ORAL_TABLET | Freq: Once | ORAL | Status: AC
  Administered 2014-08-13: 1 via ORAL
  Filled 2014-08-13: qty 1

## 2014-08-13 MED ORDER — TRIAZOLAM 0.25 MG PO TABS
0.5000 mg | ORAL_TABLET | Freq: Every evening | ORAL | Status: DC | PRN
  Administered 2014-08-13: 0.5 mg via ORAL
  Filled 2014-08-13: qty 2

## 2014-08-13 NOTE — Discharge Instructions (Signed)
Ulnar Fracture Mr. [name], take pain medication as prescribed. Follow-up with orthopedic surgery within 3 days for continued management. If symptoms worsen come back to emergency department daily. Thank you. You have a fracture (broken bone) of the forearm. This is the part of your arm between the elbow and your wrist. Your forearm is made up of two bones. These are the radius and ulna. Your fracture is in the ulna. This is the bone in your forearm located on the little finger side of your forearm. A cast or splint is used to protect and keep your injured bone from moving. The cast or splint will be on generally for about 5 to 6 weeks, with individual variations. HOME CARE INSTRUCTIONS   Keep the injured part elevated while sitting or lying down. Keep the injury above the level of your heart (the center of the chest). This will decrease swelling and pain.  Apply ice to the injury for 15-20 minutes, 03-04 times per day while awake, for 2 days. Put the ice in a plastic bag and place a towel between the bag of ice and your cast or splint.  Move your fingers to avoid stiffness and minimize swelling.  If you have a plaster or fiberglass cast:  Do not try to scratch the skin under the cast using sharp or pointed objects.  Check the skin around the cast every day. You may put lotion on any red or sore areas.  Keep your cast dry and clean.  If you have a plaster splint:  Wear the splint as directed.  You may loosen the elastic around the splint if your fingers become numb, tingle, or turn cold or blue.  Do not put pressure on any part of your cast or splint. It may break. Rest your cast only on a pillow the first 24 hours until it is fully hardened.  Your cast or splint can be protected during bathing with a plastic bag. Do not lower the cast or splint into water.  Only take over-the-counter or prescription medicines for pain, discomfort, or fever as directed by your caregiver. SEEK IMMEDIATE  MEDICAL CARE IF:   Your cast gets damaged or breaks.  You have more severe pain or swelling than you did before the cast.  You have severe pain when stretching your fingers.  There is a bad smell or new stains and/or purulent (pus like) drainage coming from under the cast. Document Released: 10/17/2005 Document Revised: 07/29/2011 Document Reviewed: 03/21/2007 Exit[employer] Patient Information 2015 [employer]. This information is not intended to replace advice given to you by your health care provider. Make sure you discuss any questions you have with your health care provider.

## 2014-08-13 NOTE — ED Notes (Signed)
Unable to attain oral temperature at this time.

## 2014-08-13 NOTE — ED Notes (Addendum)
Pt's caregiver requests follow up from left arm X-ray. Caregiver unsure if patient injured arm. No deformity noted. Unsure of X-ray results.    Addendum-patient's caregiver has X-ray result report from outside facility (shows oblique fracture). RN has written note saying patient cannot have Ketamine but can have small dose of Ativan for sedative purposes.

## 2014-08-13 NOTE — ED Provider Notes (Signed)
CSN: [id number]     Arrival date & time 08/13/14  0207 History   First MD Initiated Contact with Patient 08/13/14 0321     Chief Complaint  Patient presents with  . Follow up post X-ray      (Consider location/radiation/quality/duration/timing/severity/associated sxs/prior Treatment) HPI  [name] is a 54 y.o. male with past medical history of Down syndrome presenting today with left arm pain. Patient cannot give his own history due to his Down syndrome, however supervising the room has paperwork showing left ulnar fracture. Patient has not been using his left upper extremity since that time. Is diffusely swollen and tender to palpation. He has not received any pain medication.   Past Medical History  Diagnosis Date  . Down syndrome   . Esophageal ulcer   . Dysphagia    History reviewed. No pertinent past surgical history. History reviewed. No pertinent family history. History  Substance Use Topics  . Smoking status: Never Smoker   . Smokeless tobacco: Not on file  . Alcohol Use: No    Review of Systems  Unable to perform ROS: Patient nonverbal      Allergies  Actifed cold-allergy; Amoxicillin; Keflex; Ketamine; and Penicillins  Home Medications   Prior to Admission medications   Medication Sig Start Date End Date Taking? Authorizing Provider  Clotrimazole-Betamethasone (LOTRISONE EX) Apply 1 application topically 2 (two) times daily. Apply to neck area.   Yes Historical Provider, MD  doxycycline (VIBRA-TABS) 100 MG tablet Take 100 mg by mouth 2 (two) times daily.   Yes Historical Provider, MD  Emollient [facility] EX) Apply 1 application topically 2 (two) times daily. Apply to both arms, hands, and legs.   Yes Historical Provider, MD  hydrocortisone cream 1 % Apply 1 application topically at bedtime. Apply to face after bath.   Yes Historical Provider, MD  liver oil-zinc oxide (DESITIN) 40 % ointment Apply 1 application topically 3 (three) times daily. In  between buttocks   Yes Historical Provider, MD  montelukast (SINGULAIR) 10 MG tablet Take 10 mg by mouth daily.   Yes Historical Provider, MD  pantoprazole (PROTONIX) 20 MG tablet Take 20 mg by mouth 2 (two) times daily.    Yes Historical Provider, MD  triazolam (HALCION) 0.125 MG tablet Take 0.375 mg by mouth at bedtime as needed for sleep (prior to dental procedure).    Historical Provider, MD   BP 156/119 mmHg  Pulse 88  Resp 22  SpO2 97% Physical Exam  Constitutional: Vital signs are normal. He appears well-developed and well-nourished.  Non-toxic appearance. He does not appear ill. No distress.  HENT:  Head: Normocephalic and atraumatic.  Nose: Nose normal.  Mouth/Throat: Oropharynx is clear and moist. No oropharyngeal exudate.  Eyes: Conjunctivae and EOM are normal. Pupils are equal, round, and reactive to light. No scleral icterus.  Neck: Normal range of motion. Neck supple. No tracheal deviation, no edema, no erythema and normal range of motion present. No thyroid mass and no thyromegaly present.  Cardiovascular: Normal rate, regular rhythm, S1 normal, S2 normal, normal heart sounds, intact distal pulses and normal pulses.  Exam reveals no gallop and no friction rub.   No murmur heard. Pulses:      Radial pulses are 2+ on the right side, and 2+ on the left side.       Dorsalis pedis pulses are 2+ on the right side, and 2+ on the left side.  Pulmonary/Chest: Effort normal and breath sounds normal. No respiratory distress. He has  no wheezes. He has no rhonchi. He has no rales.  Abdominal: Soft. Normal appearance and bowel sounds are normal. He exhibits no distension, no ascites and no mass. There is no hepatosplenomegaly. There is no tenderness. There is no rebound, no guarding and no CVA tenderness.  Musculoskeletal: Normal range of motion. He exhibits edema and tenderness.  Left upper extremity with normal range of motion. There is diffuse tenderness and swelling from the shoulder  distally, most notably seen at the left wrist. Normal pulses, both radial and ulnar.  Lymphadenopathy:    He has no cervical adenopathy.  Neurological: He is alert. He has normal strength. No cranial nerve deficit or sensory deficit.  Down syndrome  Skin: Skin is warm, dry and intact. No petechiae and no rash noted. He is not diaphoretic. No erythema. No pallor.  Psychiatric: He has a normal mood and affect. His behavior is normal. Judgment normal.  Nursing note and vitals reviewed.   ED Course  Procedures (including critical care time) Labs Review Labs Reviewed - No data to display  Imaging Review Dg Shoulder 1v Left  08/13/2014   CLINICAL DATA:  Left arm/ shoulder pain after injury.  EXAM: LEFT SHOULDER - 1 VIEW  COMPARISON:  None.  FINDINGS: Single AP view of the left shoulder in internal rotation. Acromioclavicular alignment is maintained. No fracture. Glenohumeral alignment is normal for technique.  IMPRESSION: No fracture or dislocation of the left shoulder on single-view.   Electronically Signed   By: Rubye OaksMelanie  Ehinger M.D.   On: 08/13/2014 06:05   Dg Elbow Complete Left  08/13/2014   CLINICAL DATA:  Left arm/ elbow pain after injury.  EXAM: LEFT ELBOW - COMPLETE 3+ VIEW  COMPARISON:  None.  FINDINGS: No fracture or dislocation. The alignment and joint spaces are maintained. Lateral view slightly limited, patient had difficulty with positioning. No joint effusion.  IMPRESSION: No fracture or dislocation of the left elbow.   Electronically Signed   By: Rubye OaksMelanie  Ehinger M.D.   On: 08/13/2014 06:04   Dg Forearm Left  08/13/2014   CLINICAL DATA:  Left arm/ forearm pain after injury.  EXAM: LEFT FOREARM - 2 VIEW  COMPARISON:  None.  FINDINGS: There is a comminuted oblique fracture of the distal ulna involving the distal shaft and metaphysis. Displacement of less than 1/2 shaft with. No extension to the ulnar styloid. No associated radius fracture. There is soft tissue edema at the fracture  site.  IMPRESSION: Displaced distal ulnar fracture.   Electronically Signed   By: Rubye OaksMelanie  Ehinger M.D.   On: 08/13/2014 06:06   Dg Hand Complete Left  08/13/2014   CLINICAL DATA:  Left arm/hand pain after injury.  EXAM: LEFT HAND - COMPLETE 3+ VIEW  COMPARISON:  None.  FINDINGS: Distal ulnar fracture noted. Question of nondisplaced distal radius fracture at the radiocarpal articular surface. 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 Mildly short fifth metacarpal.  IMPRESSION: Distal ulnar fracture. Question of nondisplaced distal radius fracture at the radiocarpal articular surface. Remainder the hand is intact.   Electronically Signed   By: Rubye OaksMelanie  Ehinger M.D.   On: 08/13/2014 06:09     EKG Interpretation None      MDM   Final diagnoses:  Elbow pain  Arm pain    Patient since emergency department for left upper history swelling and pain. X-rays reveal a left distal ulnar comminuted fracture. [facility] was consult is for splinting. He'll be given orthopedic follow-up.Marland Kitchen. She was admitted to undergo for  pain relief and I will send him home with a prescription. His vital signs remain within his normal limits and is safe for discharge. Repeat exam after splint placement reveals good pulses and normal sensation.   Tomasita Crumble, MD 08/13/14 [phone]

## 2015-06-22 DIAGNOSIS — F72 Severe intellectual disabilities: Secondary | ICD-10-CM | POA: Insufficient documentation

## 2016-02-16 IMAGING — CR DG SHOULDER 1V*L*
1 series · 1 of 1 positions shown · non-contrast
Comparison: None.

CLINICAL DATA: Left arm/ shoulder pain after injury.

EXAM:
LEFT SHOULDER - 1 VIEW

[w shoulder external left]
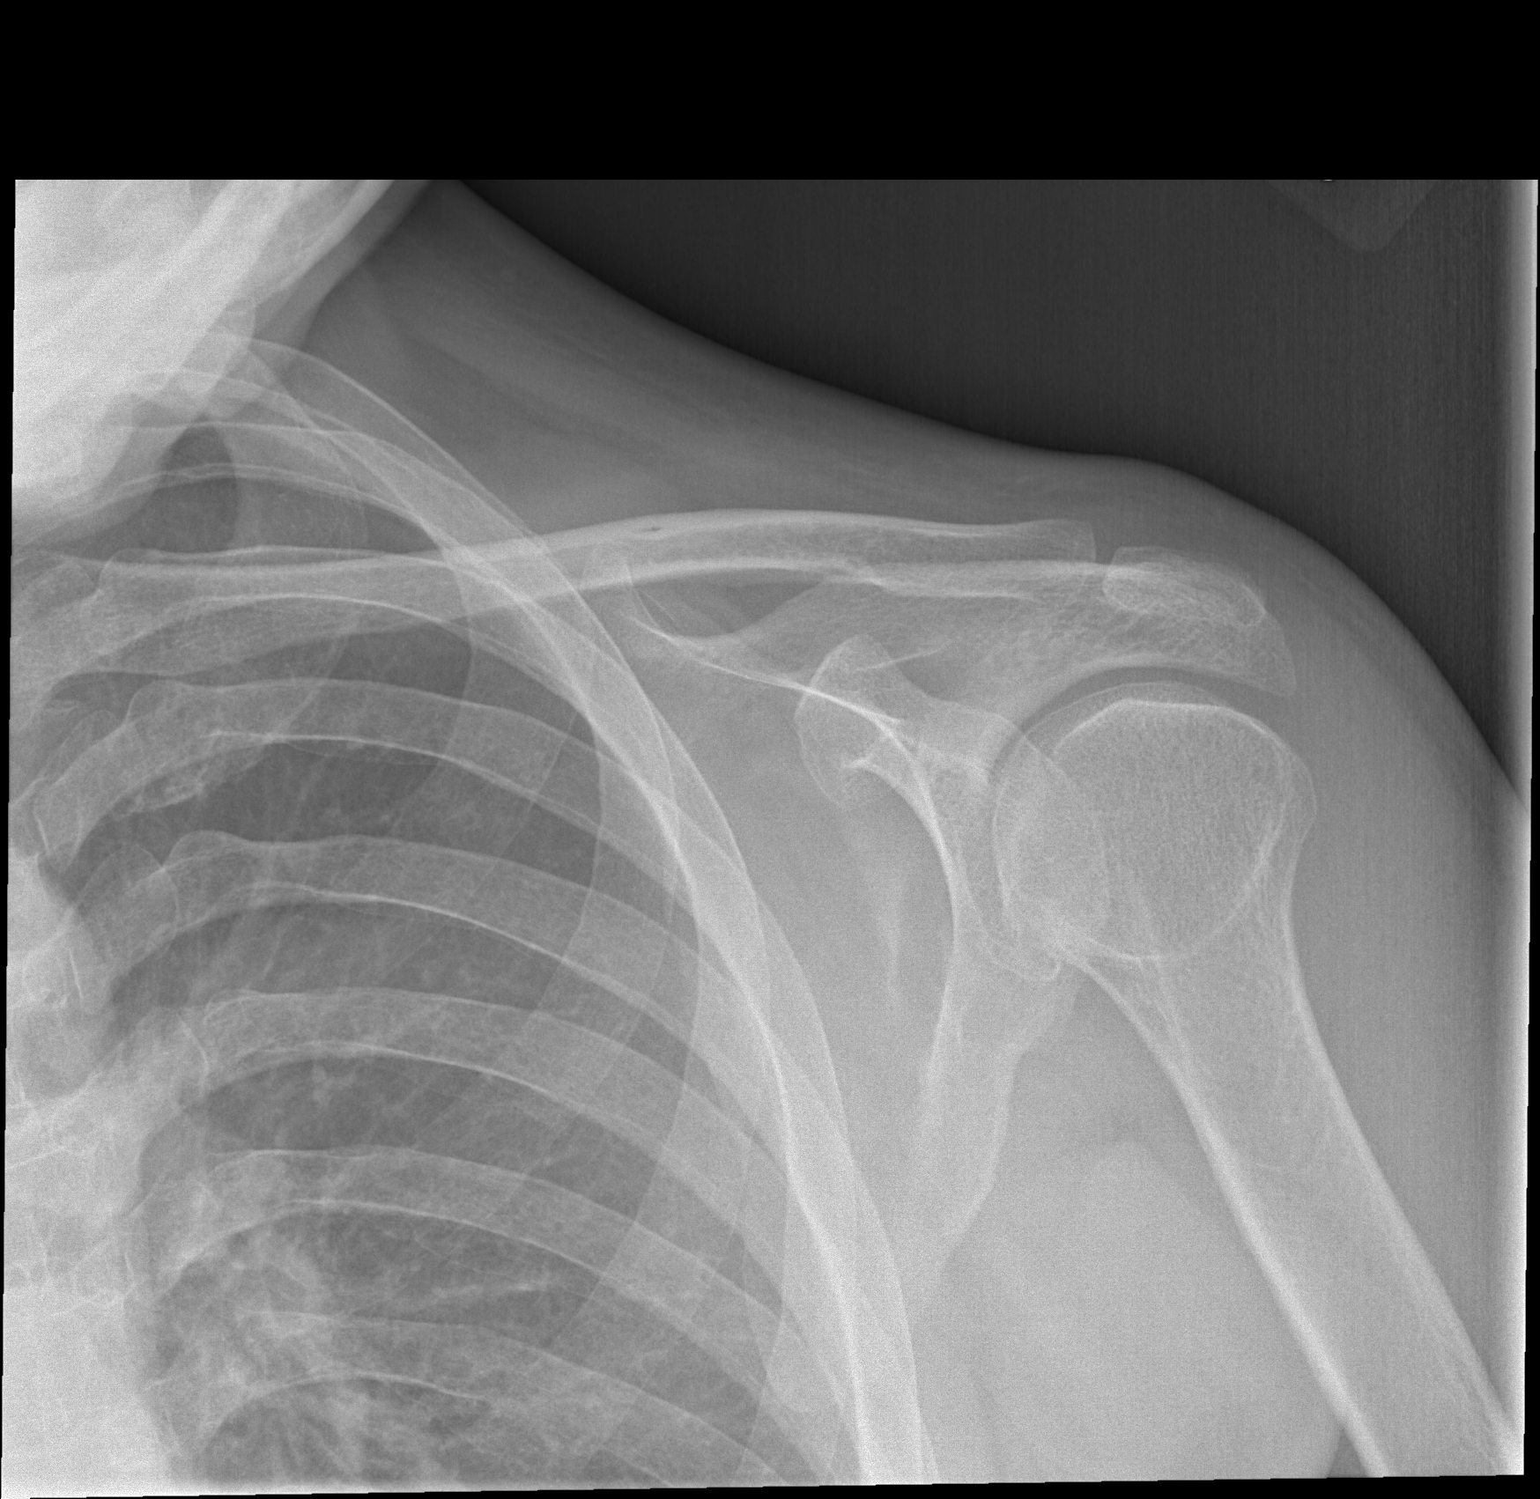

[1 of 1 positions shown; findings below may reference images not displayed]

FINDINGS: Single AP view of the left shoulder in internal rotation.
Acromioclavicular alignment is maintained. No fracture. Glenohumeral
alignment is normal for technique.
IMPRESSION: No fracture or dislocation of the left shoulder on single-view.

## 2016-02-16 IMAGING — CR DG ELBOW COMPLETE 3+V*L*
4 series · 4 of 4 positions shown · non-contrast
Comparison: None.

CLINICAL DATA: Left arm/ elbow pain after injury.

EXAM:
LEFT ELBOW - COMPLETE 3+ VIEW

[x elbow ap left]
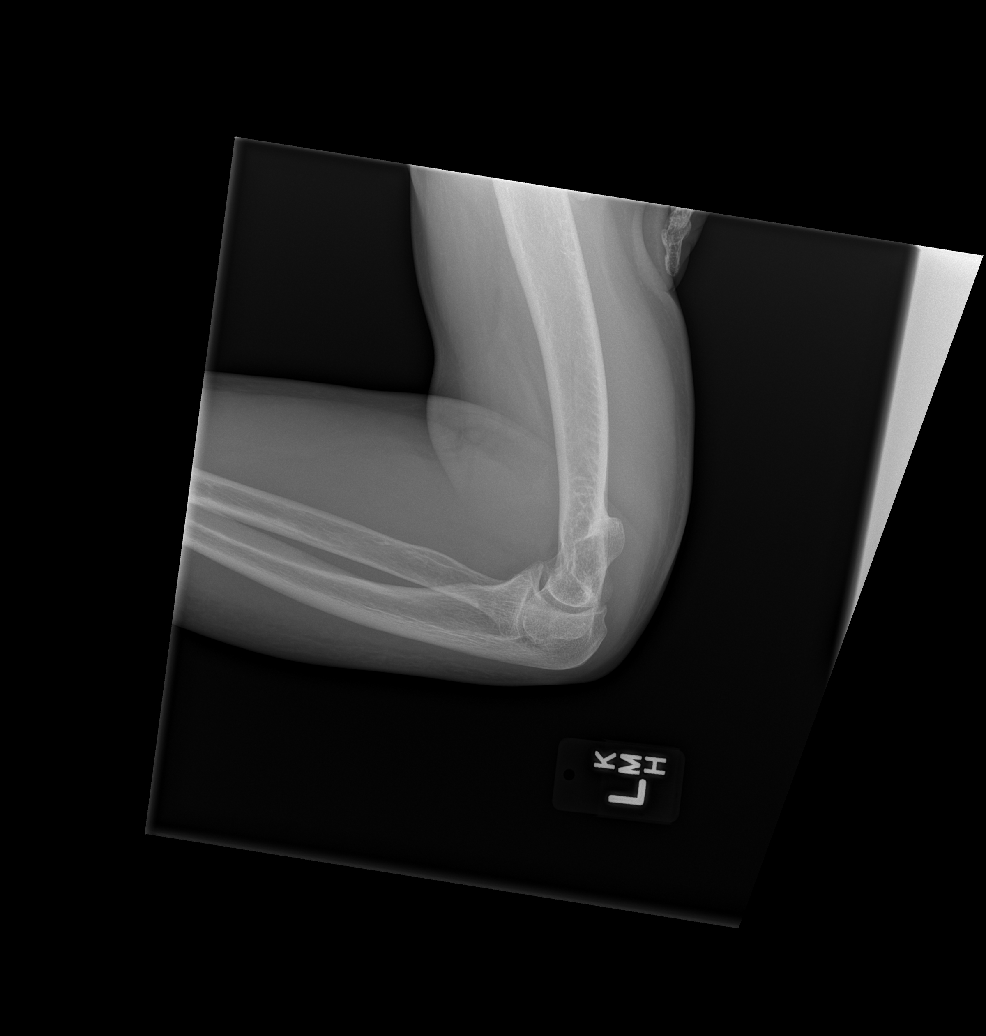

[x elbow obl left (1 of 2)]
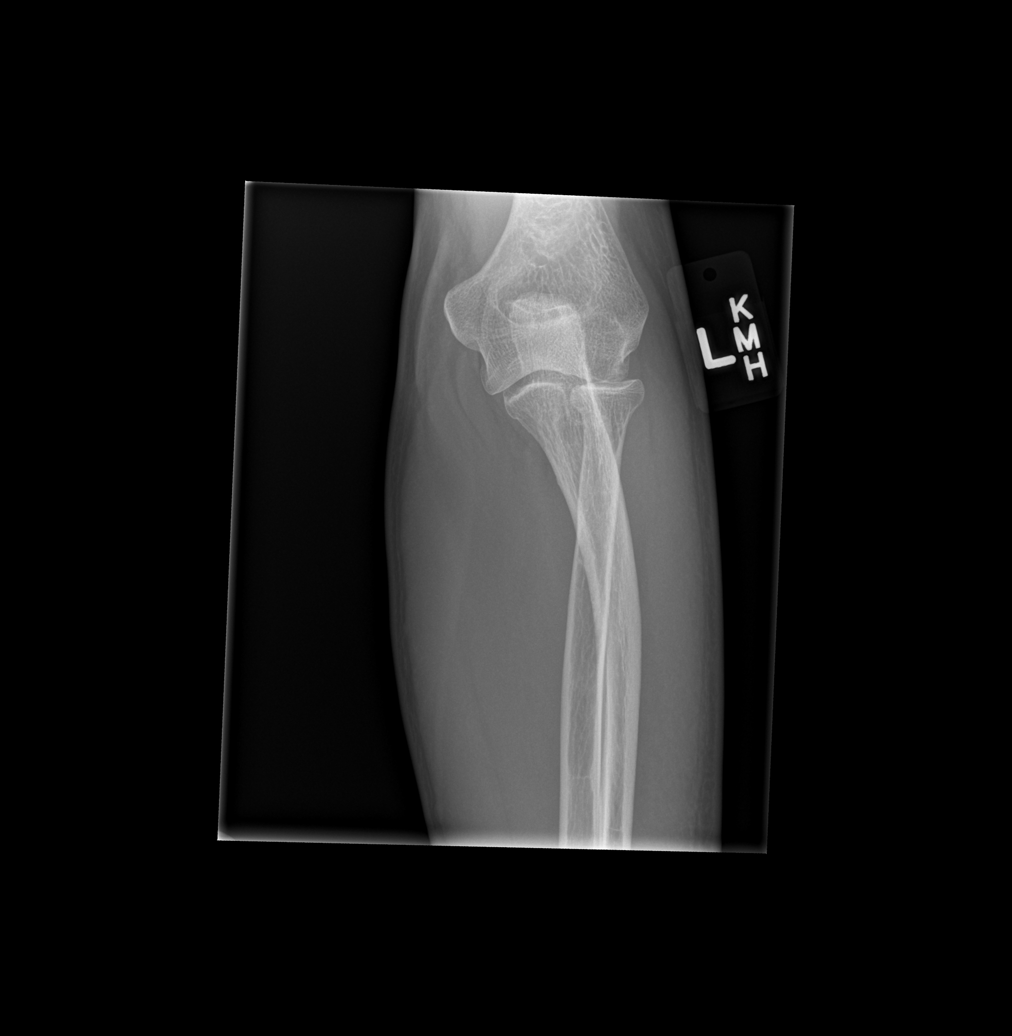

[x elbow obl left (2 of 2)]
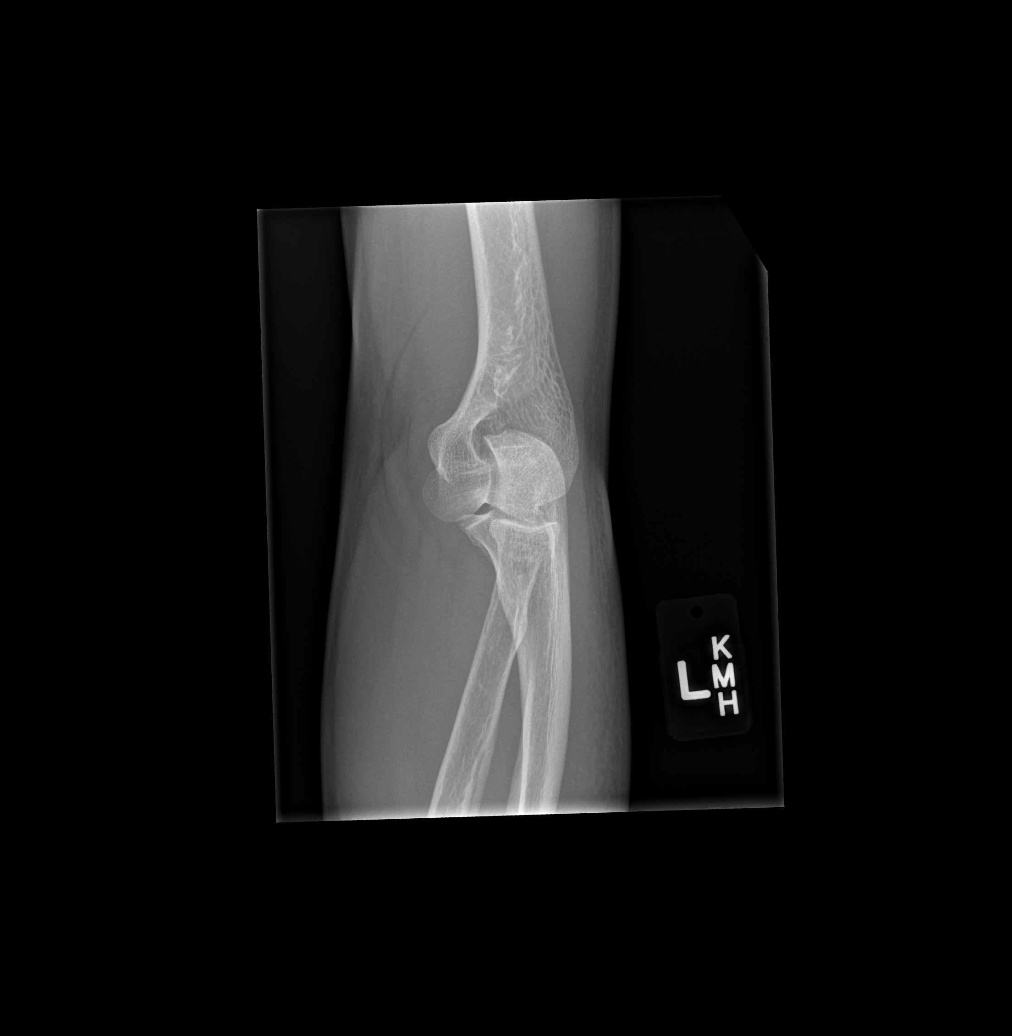

[x elbow lat left]
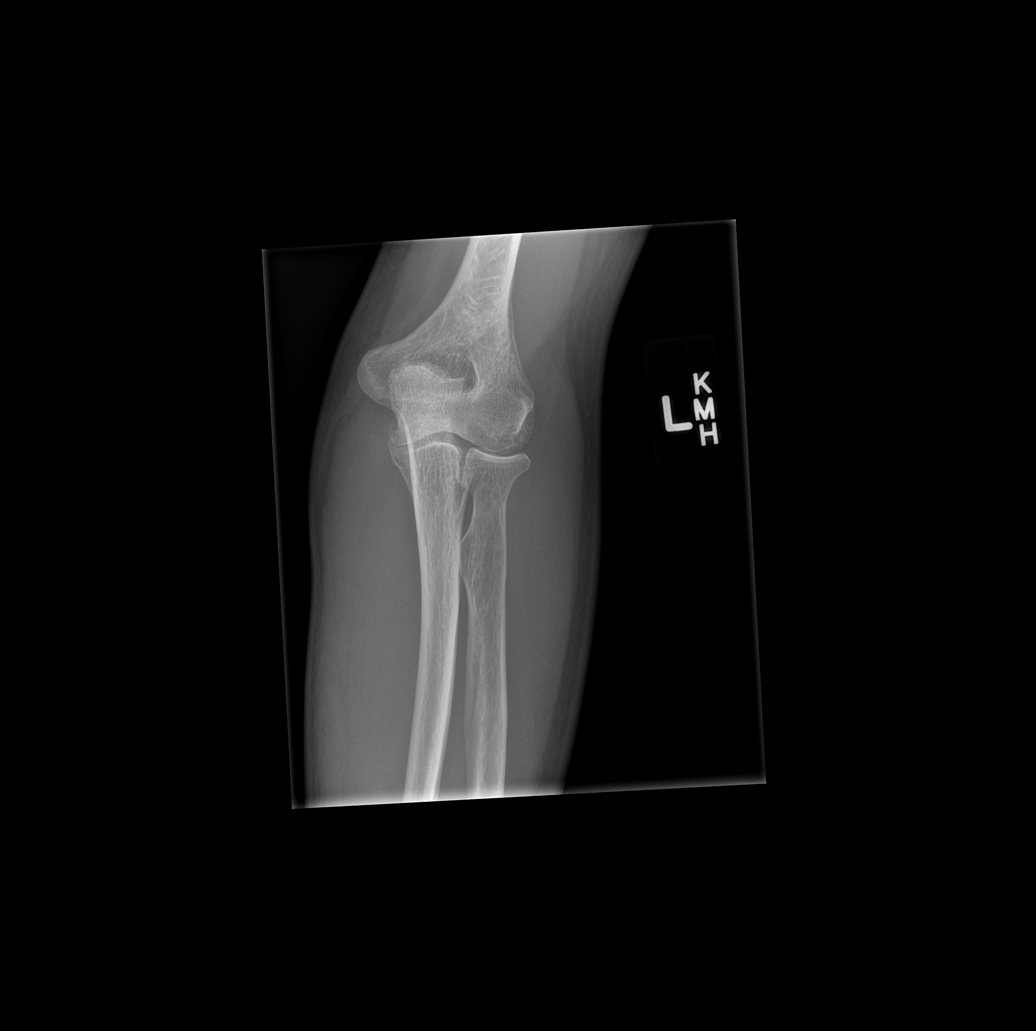

[4 of 4 positions shown; findings below may reference images not displayed]

FINDINGS: No fracture or dislocation. The alignment and joint spaces are
maintained. Lateral view slightly limited, patient had difficulty
with positioning. No joint effusion.
IMPRESSION: No fracture or dislocation of the left elbow.

## 2018-07-08 DIAGNOSIS — H66002 Acute suppurative otitis media without spontaneous rupture of ear drum, left ear: Secondary | ICD-10-CM | POA: Insufficient documentation

## 2019-02-19 DIAGNOSIS — H6123 Impacted cerumen, bilateral: Secondary | ICD-10-CM | POA: Insufficient documentation

## 2019-02-19 DIAGNOSIS — H6612 Chronic tubotympanic suppurative otitis media, left ear: Secondary | ICD-10-CM | POA: Insufficient documentation

## 2022-08-19 DIAGNOSIS — L03211 Cellulitis of face: Secondary | ICD-10-CM

## 2022-08-19 HISTORY — DX: Cellulitis of face: L03.211

## 2022-09-07 ENCOUNTER — Emergency Department (HOSPITAL_BASED_OUTPATIENT_CLINIC_OR_DEPARTMENT_OTHER): Payer: Medicare Other

## 2022-09-07 ENCOUNTER — Emergency Department (HOSPITAL_BASED_OUTPATIENT_CLINIC_OR_DEPARTMENT_OTHER)
Admission: EM | Admit: 2022-09-07 | Discharge: 2022-09-07 | Disposition: A | Discharge status: Home / Self Care | Payer: Medicare Other | Source: Home / Self Care | Attending: Emergency Medicine | Admitting: Emergency Medicine

## 2022-09-07 ENCOUNTER — Other Ambulatory Visit (HOSPITAL_BASED_OUTPATIENT_CLINIC_OR_DEPARTMENT_OTHER): Payer: Self-pay

## 2022-09-07 ENCOUNTER — Other Ambulatory Visit: Payer: Self-pay

## 2022-09-07 ENCOUNTER — Encounter (HOSPITAL_BASED_OUTPATIENT_CLINIC_OR_DEPARTMENT_OTHER): Payer: Self-pay | Admitting: Emergency Medicine

## 2022-09-07 DIAGNOSIS — R229 Localized swelling, mass and lump, unspecified: Secondary | ICD-10-CM | POA: Diagnosis not present

## 2022-09-07 DIAGNOSIS — J340 Abscess, furuncle and carbuncle of nose: Secondary | ICD-10-CM | POA: Insufficient documentation

## 2022-09-07 DIAGNOSIS — L03211 Cellulitis of face: Secondary | ICD-10-CM | POA: Diagnosis not present

## 2022-09-07 DIAGNOSIS — R220 Localized swelling, mass and lump, head: Secondary | ICD-10-CM

## 2022-09-07 DIAGNOSIS — F840 Autistic disorder: Secondary | ICD-10-CM | POA: Insufficient documentation

## 2022-09-07 LAB — COMPREHENSIVE METABOLIC PANEL
ALT: 14 U/L (ref 0–44)
AST: 20 U/L (ref 15–41)
Albumin: 3.6 g/dL (ref 3.5–5.0)
Alkaline Phosphatase: 60 U/L (ref 38–126)
Anion gap: 6 (ref 5–15)
BUN: 11 mg/dL (ref 6–20)
CO2: 30 mmol/L (ref 22–32)
Calcium: 9 mg/dL (ref 8.9–10.3)
Chloride: 103 mmol/L (ref 98–111)
Creatinine, Ser: 0.74 mg/dL (ref 0.61–1.24)
GFR, Estimated: >60 mL/min (ref >60)
Glucose, Bld: 105 mg/dL — ABNORMAL HIGH (ref 70–99)
Potassium: 4 mmol/L (ref 3.5–5.1)
Sodium: 139 mmol/L (ref 135–145)
Total Bilirubin: 0.6 mg/dL (ref 0.3–1.2)
Total Protein: 6.8 g/dL (ref 6.5–8.1)

## 2022-09-07 LAB — CBC WITH DIFFERENTIAL/PLATELET
Abs Immature Granulocytes: 0.05 10*3/uL (ref 0.00–0.07)
Basophils Absolute: 0.1 10*3/uL (ref 0.0–0.1)
Basophils Relative: 1 %
Eosinophils Absolute: 0.1 10*3/uL (ref 0.0–0.5)
Eosinophils Relative: 1 %
HCT: 42.3 % (ref 39.0–52.0)
Hemoglobin: 14 g/dL (ref 13.0–17.0)
Immature Granulocytes: 1 %
Lymphocytes Relative: 15 %
Lymphs Abs: 1.2 10*3/uL (ref 0.7–4.0)
MCH: 32.9 pg (ref 26.0–34.0)
MCHC: 33.1 g/dL (ref 30.0–36.0)
MCV: 99.3 fL (ref 80.0–100.0)
Monocytes Absolute: 1.4 10*3/uL — ABNORMAL HIGH (ref 0.1–1.0)
Monocytes Relative: 17 %
Neutro Abs: 5.5 10*3/uL (ref 1.7–7.7)
Neutrophils Relative %: 65 %
Platelets: 226 10*3/uL (ref 150–400)
RBC: 4.26 MIL/uL (ref 4.22–5.81)
RDW: 13.4 % (ref 11.5–15.5)
WBC: 8.3 10*3/uL (ref 4.0–10.5)
nRBC: 0 % (ref 0.0–0.2)

## 2022-09-07 MED ORDER — LIDOCAINE-EPINEPHRINE-TETRACAINE (LET) TOPICAL GEL
3.0000 mL | Freq: Once | TOPICAL | Status: AC
  Administered 2022-09-07: 3 mL via TOPICAL
  Filled 2022-09-07: qty 3

## 2022-09-07 MED ORDER — LIDOCAINE-EPINEPHRINE (PF) 2 %-1:200000 IJ SOLN
20.0000 mL | Freq: Once | INTRAMUSCULAR | Status: DC
  Filled 2022-09-07: qty 20

## 2022-09-07 MED ORDER — CLINDAMYCIN HCL 300 MG PO CAPS
300.0000 mg | ORAL_CAPSULE | Freq: Three times a day (TID) | ORAL | 0 refills | Status: DC
  Filled 2022-09-07: qty 21, 7d supply, fill #0

## 2022-09-07 MED ORDER — CLINDAMYCIN PHOSPHATE 600 MG/50ML IV SOLN
600.0000 mg | Freq: Once | INTRAVENOUS | Status: AC
  Administered 2022-09-07: 600 mg via INTRAVENOUS
  Filled 2022-09-07: qty 50

## 2022-09-07 MED ORDER — LORAZEPAM 2 MG/ML IJ SOLN
1.0000 mg | Freq: Once | INTRAMUSCULAR | Status: AC
  Administered 2022-09-07: 1 mg via INTRAMUSCULAR
  Filled 2022-09-07: qty 1

## 2022-09-07 NOTE — ED Provider Notes (Signed)
..  Incision and Drainage  Date/Time: 09/07/2022 12:44 PM  Performed by: Netta Corrigan, PA-C Authorized by: Netta Corrigan, PA-C   Consent:    Consent obtained:  Verbal   Consent given by:  Parent and healthcare agent   Risks, benefits, and alternatives were discussed: yes     Risks discussed:  Bleeding, incomplete drainage, pain and infection   Alternatives discussed:  No treatment Universal protocol:    Patient identity confirmed:  Hospital-assigned identification number Location:    Type:  Abscess   Size:  0.5 cm   Location:  Head   Head location:  Nose Pre-procedure details:    Skin preparation:  Chlorhexidine with alcohol Sedation:    Sedation type:  None Anesthesia:    Anesthesia method:  Topical application   Topical anesthetic:  LET Procedure type:    Complexity:  Simple Procedure details:    Ultrasound guidance: no     Needle aspiration: yes     Needle size:  22 G   Incision types:  Stab incision   Techniques: expressed.   Drainage:  Bloody   Drainage amount:  Scant   Wound treatment:  Wound left open   Packing materials:  None Post-procedure details:    Procedure completion:  Tolerated well, no immediate complications     [name] 09/07/22 1247    Glynn Octave, MD 09/07/22 1524

## 2022-09-07 NOTE — ED Notes (Addendum)
Pt's sister is also his guardian. She requests that we defer CT imaging due to concerns regarding his cooperation and ability to stay still for the duration of the procedure. She does not want to give pt more Ativan. Guardian was advised of risks of refusal/benefits of continuing the plan of care and states she understands but maintains that she does not want pt to have CT today. MD Rancour made aware. CT order cancelled

## 2022-09-07 NOTE — ED Provider Notes (Signed)
[place] [facility]ERGENC[facility] DEPARTMENT AT [facility] Provider Note   CSN: [id number] Arrival date & time: 09/07/22  1038     History  Chief Complaint  Patient presents with   Facial Swelling    [name] is a 62 y.o. male.  Level 5 caveat for autism and Down syndrome.  Patient here with swelling to his upper lip.  Caregiver reports they noticed a "bump" under his left nostril 2 days ago.  He was prescribed mupirocin which she is only used twice.  Since yesterday there is increased swelling and redness to his left upper lip and face.  No known difficulty breathing or difficulty swallowing.  No known fever.  No obvious tongue swelling.  No history of ACE inhibitor use.  Patient seems to behaving at his baseline.  No abdominal pain, nausea or vomiting.  No chest pain or shortness of breath.  No known history of diabetes. Has noticed increased swelling and redness to his left upper lip and left side of his face.  The history is provided by the patient.       Home Medications Prior to Admission medications   Medication Sig Start Date End Date Taking? Authorizing Provider  Clotrimazole-Betamethasone (LOTRISONE EX) Apply 1 application topically 2 (two) times daily. Apply to neck area.    [provider]  doxycycline (VIBRA-TABS) 100 MG tablet Take 100 mg by mouth 2 (two) times daily.    [provider]  Emollient [facility] EX) Apply 1 application topically 2 (two) times daily. Apply to both arms, hands, and legs.    [provider]  HYDROcodone-acetaminophen (NORCO/VICODIN) 5-325 MG per tablet Take 1 tablet by mouth 2 (two) times daily as needed for severe pain. 08/13/14   Tomasita Crumble, MD  hydrocortisone cream 1 % Apply 1 application topically at bedtime. Apply to face after bath.    [provider]  liver oil-zinc oxide (DESITIN) 40 % ointment Apply 1 application topically 3 (three) times daily. In between buttocks    [provider]   montelukast (SINGULAIR) 10 MG tablet Take 10 mg by mouth daily.    [provider]  pantoprazole (PROTONIX) 20 MG tablet Take 20 mg by mouth 2 (two) times daily.     [provider]  triazolam (HALCION) 0.125 MG tablet Take 0.375 mg by mouth at bedtime as needed for sleep (prior to dental procedure).    [provider]      Allergies    Actifed cold-allergy [chlorpheniramine-phenylephrine], Amoxicillin, Keflex [cephalexin], Ketamine, and Penicillins    Review of Systems   Review of Systems  Unable to perform ROS: Patient nonverbal    Physical Exam Updated Vital Signs BP (!) 142/103 (BP Location: Right Arm)   Pulse 60   Temp 97.7 F (36.5 C) (Temporal)   Resp 20   Wt 50.8 kg   SpO2 100%  Physical Exam Vitals and nursing note reviewed.  Constitutional:      General: He is not in acute distress.    Appearance: He is well-developed.  HENT:     Head: Normocephalic and atraumatic.     Nose:     Comments: Pustule to left base of nose.  Surrounding erythema and induration without discrete fluctuance.  Left upper lip swelling.  No obvious tongue swelling.  Difficult to visualize posterior pharynx    Mouth/Throat:     Pharynx: No oropharyngeal exudate.  Eyes:     Conjunctiva/sclera: Conjunctivae normal.     Pupils: Pupils are equal,  round, and reactive to light.  Neck:     Comments: No meningismus. Cardiovascular:     Rate and Rhythm: Normal rate and regular rhythm.     Heart sounds: Normal heart sounds. No murmur heard. Pulmonary:     Effort: Pulmonary effort is normal. No respiratory distress.     Breath sounds: Normal breath sounds.  Abdominal:     Palpations: Abdomen is soft.     Tenderness: There is no abdominal tenderness. There is no guarding or rebound.  Musculoskeletal:        General: No tenderness. Normal range of motion.     Cervical back: Normal range of motion and neck supple.  Skin:    General: Skin is warm.  Neurological:      Mental Status: He is alert.     Cranial Nerves: No cranial nerve deficit.     Motor: No abnormal muscle tone.     Coordination: Coordination normal.     Comments: Intermittently agitated at baseline.  Moves all extremities.  At baseline per caregiver.  Psychiatric:        Behavior: Behavior normal.     ED Results / Procedures / Treatments   Labs (all labs ordered are listed, but only abnormal results are displayed) Labs Reviewed  CBC WITH DIFFERENTIAL/PLATELET - Abnormal; Notable for the following components:      Result Value   Monocytes Absolute 1.4 (*)    All other components within normal limits  COMPREHENSIVE METABOLIC PANEL - Abnormal; Notable for the following components:   Glucose, Bld 105 (*)    All other components within normal limits    EKG None  Radiology No results found.  Procedures Procedures    Medications Ordered in ED Medications  clindamycin (CLEOCIN) IVPB 600 mg (has no administration in time range)    ED Course/ Medical Decision Making/ A&P                             Medical Decision Making Amount and/or Complexity of Data Reviewed Labs: ordered. Decision-making details documented in ED Course. Radiology: ordered and independent interpretation performed. Decision-making details documented in ED Course. ECG/medicine tests: ordered and independent interpretation performed. Decision-making details documented in ED Course.  Risk Prescription drug management.   Pustule to left upper lip with mild surrounding cellulitis.  No discrete abscess appreciated.  Vitals are stable, no distress.  No fever.  No tongue swelling.  Low suspicion for angioedema. No ACE inhibitor use.  Patient given IV antibiotics for concern for facial cellulitis.  No evidence of drainable abscess.  No fever or leukocytosis.  Initial plan was for CT scan to further evaluate facial swelling as patient is poorly cooperative with exam even with sedation medications.  Family  would prefer to decline CT at this time and believes the swelling is improved.  They worry that he will not cooperate for the CT scan even with sedation and images will not be useful.  This seems reasonable.  The patient does have some induration to his upper lip but no evidence of fluctuance or drainable abscess.  Needle aspiration was attempted but minimal drainage was obtained.  No tongue swelling or posterior pharynx swelling.  Low suspicion for angioedema.  Will give of antibiotics.  Recommend close follow-up within 48 hours for wound check.  Instructed family to bring patient back to the ED for wound check within 48 hours.  Return to the ED sooner with difficulty  breathing, difficulty swallowing, chest pain, shortness of breath or other concerns.        Final Clinical Impression(s) / ED Diagnoses Final diagnoses:  Facial swelling    Rx / DC Orders ED Discharge Orders     None         Leili Eskenazi, Jeannett Senior, MD 09/07/22 1524    Glynn Octave, MD 09/09/22 1048

## 2022-09-07 NOTE — Discharge Instructions (Signed)
Take the antibiotic as prescribed for a facial cellulitis.  You declined CT scan today.  Wound should be checked again in 2 days to ensure it is not worsening.  Return to the ED for further evaluation.  Return to the ED sooner with difficulty breathing, difficulty swallowing, not able to eat or drink, worsening facial swelling or any other concerns.

## 2022-09-07 NOTE — ED Triage Notes (Signed)
Pt arrives to ED with c/o left sided facial swelling.

## 2022-09-09 ENCOUNTER — Inpatient Hospital Stay (HOSPITAL_BASED_OUTPATIENT_CLINIC_OR_DEPARTMENT_OTHER)
Admission: EM | Admit: 2022-09-09 | Discharge: 2022-09-12 | DRG: 603 | Disposition: A | Discharge status: Home / Self Care | Payer: Medicare Other | Attending: Internal Medicine | Admitting: Internal Medicine

## 2022-09-09 ENCOUNTER — Emergency Department (HOSPITAL_BASED_OUTPATIENT_CLINIC_OR_DEPARTMENT_OTHER): Payer: Medicare Other

## 2022-09-09 ENCOUNTER — Encounter (HOSPITAL_BASED_OUTPATIENT_CLINIC_OR_DEPARTMENT_OTHER): Payer: Self-pay

## 2022-09-09 ENCOUNTER — Other Ambulatory Visit (HOSPITAL_BASED_OUTPATIENT_CLINIC_OR_DEPARTMENT_OTHER): Payer: Self-pay

## 2022-09-09 ENCOUNTER — Other Ambulatory Visit: Payer: Self-pay

## 2022-09-09 DIAGNOSIS — Z884 Allergy status to anesthetic agent status: Secondary | ICD-10-CM

## 2022-09-09 DIAGNOSIS — F79 Unspecified intellectual disabilities: Secondary | ICD-10-CM | POA: Diagnosis present

## 2022-09-09 DIAGNOSIS — Z888 Allergy status to other drugs, medicaments and biological substances status: Secondary | ICD-10-CM | POA: Diagnosis not present

## 2022-09-09 DIAGNOSIS — Q909 Down syndrome, unspecified: Secondary | ICD-10-CM

## 2022-09-09 DIAGNOSIS — Z881 Allergy status to other antibiotic agents status: Secondary | ICD-10-CM | POA: Diagnosis not present

## 2022-09-09 DIAGNOSIS — L03211 Cellulitis of face: Secondary | ICD-10-CM | POA: Diagnosis present

## 2022-09-09 DIAGNOSIS — Z8719 Personal history of other diseases of the digestive system: Secondary | ICD-10-CM | POA: Diagnosis not present

## 2022-09-09 DIAGNOSIS — Z79899 Other long term (current) drug therapy: Secondary | ICD-10-CM

## 2022-09-09 DIAGNOSIS — F840 Autistic disorder: Secondary | ICD-10-CM | POA: Diagnosis present

## 2022-09-09 DIAGNOSIS — Z66 Do not resuscitate: Secondary | ICD-10-CM | POA: Diagnosis present

## 2022-09-09 DIAGNOSIS — R229 Localized swelling, mass and lump, unspecified: Secondary | ICD-10-CM | POA: Diagnosis present

## 2022-09-09 DIAGNOSIS — L0201 Cutaneous abscess of face: Secondary | ICD-10-CM | POA: Diagnosis present

## 2022-09-09 DIAGNOSIS — Z880 Allergy status to penicillin: Secondary | ICD-10-CM

## 2022-09-09 LAB — COMPREHENSIVE METABOLIC PANEL
ALT: 12 U/L (ref 0–44)
AST: 17 U/L (ref 15–41)
Albumin: 3.4 g/dL — ABNORMAL LOW (ref 3.5–5.0)
Alkaline Phosphatase: 61 U/L (ref 38–126)
Anion gap: 8 (ref 5–15)
BUN: 11 mg/dL (ref 6–20)
CO2: 28 mmol/L (ref 22–32)
Calcium: 9.1 mg/dL (ref 8.9–10.3)
Chloride: 101 mmol/L (ref 98–111)
Creatinine, Ser: 0.7 mg/dL (ref 0.61–1.24)
GFR, Estimated: >60 mL/min (ref >60)
Glucose, Bld: 104 mg/dL — ABNORMAL HIGH (ref 70–99)
Potassium: 3.8 mmol/L (ref 3.5–5.1)
Sodium: 137 mmol/L (ref 135–145)
Total Bilirubin: 0.5 mg/dL (ref 0.3–1.2)
Total Protein: 6.8 g/dL (ref 6.5–8.1)

## 2022-09-09 LAB — CBC WITH DIFFERENTIAL/PLATELET
Abs Immature Granulocytes: 0.04 10*3/uL (ref 0.00–0.07)
Basophils Absolute: 0.1 10*3/uL (ref 0.0–0.1)
Basophils Relative: 1 %
Eosinophils Absolute: 0.1 10*3/uL (ref 0.0–0.5)
Eosinophils Relative: 1 %
HCT: 41.1 % (ref 39.0–52.0)
Hemoglobin: 13.8 g/dL (ref 13.0–17.0)
Immature Granulocytes: 0 %
Lymphocytes Relative: 13 %
Lymphs Abs: 1.3 10*3/uL (ref 0.7–4.0)
MCH: 33.1 pg (ref 26.0–34.0)
MCHC: 33.6 g/dL (ref 30.0–36.0)
MCV: 98.6 fL (ref 80.0–100.0)
Monocytes Absolute: 1.4 10*3/uL — ABNORMAL HIGH (ref 0.1–1.0)
Monocytes Relative: 14 %
Neutro Abs: 6.9 10*3/uL (ref 1.7–7.7)
Neutrophils Relative %: 71 %
Platelets: 230 10*3/uL (ref 150–400)
RBC: 4.17 MIL/uL — ABNORMAL LOW (ref 4.22–5.81)
RDW: 13.2 % (ref 11.5–15.5)
WBC: 9.8 10*3/uL (ref 4.0–10.5)
nRBC: 0 % (ref 0.0–0.2)

## 2022-09-09 LAB — MRSA NEXT GEN BY PCR, NASAL: MRSA by PCR Next Gen: NOT DETECTED

## 2022-09-09 LAB — LACTIC ACID, PLASMA: Lactic Acid, Venous: 1.9 mmol/L (ref 0.5–1.9)

## 2022-09-09 MED ORDER — ENOXAPARIN SODIUM 40 MG/0.4ML IJ SOSY
40.0000 mg | PREFILLED_SYRINGE | INTRAMUSCULAR | Status: DC
  Administered 2022-09-10 – 2022-09-11 (×2): 40 mg via SUBCUTANEOUS
  Filled 2022-09-09 (×3): qty 0.4

## 2022-09-09 MED ORDER — VANCOMYCIN HCL IN DEXTROSE 1-5 GM/200ML-% IV SOLN
1000.0000 mg | Freq: Once | INTRAVENOUS | Status: AC
  Administered 2022-09-09: 1000 mg via INTRAVENOUS
  Filled 2022-09-09: qty 200

## 2022-09-09 MED ORDER — DIAZEPAM 5 MG/ML IJ SOLN: 2.5000 mg | Freq: Once | INTRAMUSCULAR | Status: DC

## 2022-09-09 MED ORDER — VANCOMYCIN HCL IN DEXTROSE 1-5 GM/200ML-% IV SOLN: 1000.0000 mg | INTRAVENOUS | Status: DC

## 2022-09-09 MED ORDER — SODIUM CHLORIDE 0.9 % IV SOLN: INTRAVENOUS | Status: DC

## 2022-09-09 MED ORDER — LORAZEPAM 2 MG/ML IJ SOLN: 1.0000 mg | Freq: Once | INTRAMUSCULAR | Status: DC

## 2022-09-09 MED ORDER — LINEZOLID 600 MG PO TABS
600.0000 mg | ORAL_TABLET | Freq: Two times a day (BID) | ORAL | Status: DC
  Administered 2022-09-09 – 2022-09-10 (×2): 600 mg via ORAL
  Filled 2022-09-09 (×2): qty 1

## 2022-09-09 MED ORDER — LORAZEPAM 2 MG/ML IJ SOLN
1.0000 mg | Freq: Once | INTRAMUSCULAR | Status: AC
  Administered 2022-09-09: 1 mg via INTRAMUSCULAR
  Filled 2022-09-09: qty 1

## 2022-09-09 MED ORDER — DIAZEPAM 5 MG/ML IJ SOLN
2.0000 mg | Freq: Once | INTRAMUSCULAR | Status: AC
  Administered 2022-09-09: 2 mg via INTRAVENOUS
  Filled 2022-09-09: qty 2

## 2022-09-09 MED ORDER — QUETIAPINE 12.5 MG HALF TABLET
12.5000 mg | ORAL_TABLET | Freq: Every day | ORAL | Status: DC
  Administered 2022-09-09 – 2022-09-11 (×3): 12.5 mg via ORAL
  Filled 2022-09-09 (×4): qty 1

## 2022-09-09 MED ORDER — DIAZEPAM 5 MG/ML IJ SOLN
1.0000 mg | Freq: Once | INTRAMUSCULAR | Status: AC
  Administered 2022-09-09: 1 mg via INTRAVENOUS

## 2022-09-09 MED ORDER — IOHEXOL 300 MG/ML  SOLN
75.0000 mL | Freq: Once | INTRAMUSCULAR | Status: AC | PRN
  Administered 2022-09-09: 75 mL via INTRAVENOUS

## 2022-09-09 NOTE — H&P (Signed)
History and Physical    [name] ZOX:[id number] DOB: November 09, 1960 DOA: 09/09/2022  PCP: Pcp, No Patient coming from: group home  Chief Complaint: facial swelling  HPI: [name] is a 62 y.o. male with medical history significant of Down syndrome, autism low functioning admitted with facial swelling and redness.  History obtained from the patient's sister.  Patient came to the ER few days ago from the group home he was given clindamycin.  Since swelling and the redness has not been getting better they brought him back to the hospital.  It started as a pustule in his left nose with increased swelling and erythema around the upper lip and left side of his nose.  He was given vancomycin lorazepam and diazepam in the ER.  ED Course: He was given vancomycin. Blood pressure 103/70 pulse is 100 temperature 98 -99% on room air Normal white count normal renal function Maxillofacial CT- 1.8 x 2.2 x 2.0 cm soft tissue abscess in the infranasal pre maxillary soft tissues slightly to the left of midline. Low-density regions are also seen along the columella, the anterior nasal septum, and along the nasal bridge, which may represent additional regions of abscess or phlegmonous change.   Review of Systems: As per HPI otherwise all other systems reviewed and are negative  Ambulatory Status: Bedbound  Past Medical History:  Diagnosis Date   Down syndrome    Dysphagia    Esophageal ulcer     History reviewed. No pertinent surgical history.  Social History   Socioeconomic History   Marital status: Married    Spouse name: Not on file   Number of children: Not on file   Years of education: Not on file   Highest education level: Not on file  Occupational History   Not on file  Tobacco Use   Smoking status: Never   Smokeless tobacco: Not on file  Substance and Sexual Activity   Alcohol use: No   Drug use: Not on file   Sexual activity: Not on file  Other Topics Concern    Not on file  Social History Narrative   Not on file   Social Determinants of Health   Financial Resource Strain: Not on file  Food Insecurity: Not on file  Transportation Needs: Not on file  Physical Activity: Not on file  Stress: Not on file  Social Connections: Not on file  Intimate Partner Violence: Not on file    Allergies  Allergen Reactions   Actifed Cold-Allergy [Chlorpheniramine-Phenylephrine]     unknown   Amoxicillin     unknown   Keflex [Cephalexin]     unknown   Ketamine Other (See Comments)    Drops respiration down to a dangerous level.    Penicillins     unknown    History reviewed. No pertinent family history.    Prior to Admission medications   Medication Sig Start Date End Date Taking? Authorizing Provider  mupirocin ointment (BACTROBAN) 2 % Apply 1 Application topically 2 (two) times daily. 09/06/22  Yes [provider]  clindamycin (CLEOCIN) 300 MG capsule Take 1 capsule (300 mg total) by mouth 3 (three) times daily for 7 days. 09/07/22 09/14/22  Rancour, Jeannett Senior, MD  Clotrimazole-Betamethasone (LOTRISONE EX) Apply 1 application topically 2 (two) times daily. Apply to neck area.    [provider]  doxycycline (VIBRA-TABS) 100 MG tablet Take 100 mg by mouth 2 (two) times daily.    [provider]  Emollient [facility] EX) Apply 1 application topically  2 (two) times daily. Apply to both arms, hands, and legs.    [provider]  HYDROcodone-acetaminophen (NORCO/VICODIN) 5-325 MG per tablet Take 1 tablet by mouth 2 (two) times daily as needed for severe pain. 08/13/14   Tomasita Crumble, MD  hydrocortisone cream 1 % Apply 1 application topically at bedtime. Apply to face after bath.    [provider]  liver oil-zinc oxide (DESITIN) 40 % ointment Apply 1 application topically 3 (three) times daily. In between buttocks    [provider]  methenamine (HIPREX) 1 g tablet Take 1 g by mouth 2 (two) times daily.     [provider]  montelukast (SINGULAIR) 10 MG tablet Take 10 mg by mouth daily.    [provider]  pantoprazole (PROTONIX) 20 MG tablet Take 20 mg by mouth 2 (two) times daily.     [provider]  triazolam (HALCION) 0.125 MG tablet Take 0.375 mg by mouth at bedtime as needed for sleep (prior to dental procedure).    [provider]    Physical Exam: Vitals:   09/09/22 1240 09/09/22 1323 09/09/22 1355 09/09/22 1551  BP: 115/73  112/69 103/70  Pulse: (!) 105 78 (!) 106 100  Resp: 18 18 18 18   Temp:      TempSrc:      SpO2: 98% 97% 97% 99%  Weight:      Height:         General:  Appears irritable Eyes:  PERRL, EOMI, normal lids, iris ENT: swollen upper lip red erythema Cardiovascular:  RRR.  Respiratory:  CTA bilaterally, no w/r/r. Normal respiratory effort. Abdomen:  soft, ntnd, NABS Neurologic:  MR Labs on Admission: I have personally reviewed following labs and imaging studies  CBC: Recent Labs  Lab 09/07/22 1146 09/09/22 1144  WBC 8.3 9.8  NEUTROABS 5.5 6.9  HGB 14.0 13.8  HCT 42.3 41.1  MCV 99.3 98.6  PLT 226 230   Basic Metabolic Panel: Recent Labs  Lab 09/07/22 1146 09/09/22 1144  NA 139 137  K 4.0 3.8  CL 103 101  CO2 30 28  GLUCOSE 105* 104*  BUN 11 11  CREATININE 0.74 0.70  CALCIUM 9.0 9.1   GFR: Estimated Creatinine Clearance: 63.8 mL/min (by C-G formula based on SCr of 0.7 mg/dL). Liver Function Tests: Recent Labs  Lab 09/07/22 1146 09/09/22 1144  AST 20 17  ALT 14 12  ALKPHOS 60 61  BILITOT 0.6 0.5  PROT 6.8 6.8  ALBUMIN 3.6 3.4*   No results for input(s): "LIPASE", "AMYLASE" in the last 168 hours. No results for input(s): "AMMONIA" in the last 168 hours. Coagulation Profile: No results for input(s): "INR", "PROTIME" in the last 168 hours. Cardiac Enzymes: No results for input(s): "CKTOTAL", "CKMB", "CKMBINDEX", "TROPONINI" in the last 168 hours. BNP (last 3 results) No results for input(s):  "PROBNP" in the last 8760 hours. HbA1C: No results for input(s): "HGBA1C" in the last 72 hours. CBG: No results for input(s): "GLUCAP" in the last 168 hours. Lipid Profile: No results for input(s): "CHOL", "HDL", "LDLCALC", "TRIG", "CHOLHDL", "LDLDIRECT" in the last 72 hours. Thyroid Function Tests: No results for input(s): "TSH", "T4TOTAL", "FREET4", "T3FREE", "THYROIDAB" in the last 72 hours. Anemia Panel: No results for input(s): "VITAMINB12", "FOLATE", "FERRITIN", "TIBC", "IRON", "RETICCTPCT" in the last 72 hours. Urine analysis: No results found for: "COLORURINE", "APPEARANCEUR", "LABSPEC", "PHURINE", "GLUCOSEU", "HGBUR", "BILIRUBINUR", "KETONESUR", "PROTEINUR", "UROBILINOGEN", "NITRITE", "LEUKOCYTESUR"  Creatinine Clearance: Estimated Creatinine Clearance: 63.8 mL/min (by C-G formula based  on SCr of 0.7 mg/dL).  Sepsis Labs: (procalcitonin:4,lacticidven:4) )No results found for this or any previous visit (from the past 240 hour(s)).   Radiological Exams on Admission: CT Maxillofacial W Contrast  Result Date: 09/09/2022 CLINICAL DATA:  Nasal abscess infection. EXAM: CT MAXILLOFACIAL WITH CONTRAST TECHNIQUE: Multidetector CT imaging of the maxillofacial structures was performed with intravenous contrast. Multiplanar CT image reconstructions were also generated. RADIATION DOSE REDUCTION: This exam was performed according to the departmental dose-optimization program which includes automated exposure control, adjustment of the mA and/or kV according to patient size and/or use of iterative reconstruction technique. CONTRAST:  75mL OMNIPAQUE IOHEXOL 300 MG/ML  SOLN COMPARISON:  None Available. FINDINGS: Osseous: No fracture or mandibular dislocation. No destructive process. Orbits: Negative. No traumatic or inflammatory finding.1 Sinuses: Trace mucosal thickening in the bilateral maxillary sinuses. Paranasal sinuses are otherwise clear. Soft tissues: There is a 1.8 x 2.2 x 2.0 cm  soft tissue abscess in the infranasal pre maxillary soft tissues slightly to the left of midline (series 2, image 57). Low-density regions are also seen along the columella, the anterior nasal septum (series 2, image 43), and along the nasal bridge, which may represent additional regions of abscess or phlegmonous change. Limited intracranial: No significant or unexpected finding. IMPRESSION: 1.8 x 2.2 x 2.0 cm soft tissue abscess in the infranasal pre maxillary soft tissues slightly to the left of midline. Low-density regions are also seen along the columella, the anterior nasal septum, and along the nasal bridge, which may represent additional regions of abscess or phlegmonous change. Electronically Signed   By: Lorenza Cambridge M.D.   On: 09/09/2022 12:50    Assessment/Plan Principal Problem:   Facial cellulitis    #1 left facial cellulitis with abscess-will treat him with Zyvox 600 mg p.o. twice daily Consult ENT in the a.m. Hold home medications which are really not necessary to restart tonight Will order Seroquel 12.5 mg tonight to sleep and to keep him comfortable.  check MRSA PCR  #2 goals of care discussed with patient's sister he is-DNR. Estimated body mass index is 23.41 kg/m as calculated from the following:   Height as of this encounter:  (1.473 m).   Weight as of this encounter: 50.8 kg.   DVT prophylaxis: Lovenox Code Status: DNR Family Communication: Discussed with sister at bedside Disposition Plan: Pending clinical improvement Consults called: None Admission status: Inpatient and   Alwyn Ren MD  09/09/2022, 4:54 PM

## 2022-09-09 NOTE — ED Notes (Signed)
Handoff report given to [facility] 

## 2022-09-09 NOTE — Progress Notes (Signed)
Plan of Care Note for accepted transfer   Patient: [name] MRN: [id number]   DOA: 09/09/2022  Facility requesting transfer: [facility] Requesting Provider: Virgina Norfolk, DO. Reason for transfer: Facial cellulitis. Facility course:  Per Dr. Lockie Mola: "Chief Complaint  Patient presents with   Facial Swelling   [name] is a 62 y.o. male.   Level 5 caveat as patient unable to give history due to Down syndrome.  He has been on clindamycin for the last couple days for facial cellulitis.  Had a pustule at his left nose and then he started to have some swelling around that area in the left side of his face but now swelling is gotten worse including more of the face and the upper lip.   The history is provided by a caregiver and a relative."  Lab work: Lactic acid, plasma [[id number]]   Collected: 09/09/22 1144   Updated: 09/09/22 1222   Specimen Type: Blood   Specimen Source: Vein    Lactic Acid, Venous 1.9 mmol/L  Comprehensive metabolic panel [[id number]] (Abnormal)   Collected: 09/09/22 1144   Updated: 09/09/22 1219   Specimen Type: Blood   Specimen Source: Vein    Sodium 137 mmol/L   Potassium 3.8 mmol/L   Chloride 101 mmol/L   CO2 28 mmol/L   Glucose, Bld 104 High  mg/dL   BUN 11 mg/dL   Creatinine, Ser 0.86 mg/dL   Calcium 9.1 mg/dL   Total Protein 6.8 g/dL   Albumin 3.4 Low  g/dL   AST 17 U/L   ALT 12 U/L   Alkaline Phosphatase 61 U/L   Total Bilirubin 0.5 mg/dL   GFR, Estimated >57 mL/min   Anion gap 8  CBC with Differential [[id number]] (Abnormal)   Collected: 09/09/22 1144   Updated: 09/09/22 1154   Specimen Type: Blood   Specimen Source: Vein    WBC 9.8 K/uL   RBC 4.17 Low  MIL/uL   Hemoglobin 13.8 g/dL   HCT 84.1 %   MCV 32.4 fL   MCH 33.1 pg   MCHC 33.6 g/dL   RDW 40.1 %   Platelets 230 K/uL   nRBC 0.0 %   Neutrophils Relative % 71 %   Neutro Abs 6.9 K/uL   Lymphocytes Relative 13 %   Lymphs Abs 1.3 K/uL   Monocytes Relative 14 %    Monocytes Absolute 1.4 High  K/uL   Eosinophils Relative 1 %   Eosinophils Absolute 0.1 K/uL   Basophils Relative 1 %   Basophils Absolute 0.1 K/uL   Immature Granulocytes 0 %   Abs Immature Granulocytes 0.04 K/uL   Imaging: CT maxillofacial with contrast   IMPRESSION: 1.8 x 2.2 x 2.0 cm soft tissue abscess in the infranasal pre maxillary soft tissues slightly to the left of midline. Low-density regions are also seen along the columella, the anterior nasal septum, and along the nasal bridge, which may represent additional regions of abscess or phlegmonous change.   Electronically Signed   By: Lorenza Cambridge M.D.   On: 09/09/2022 12:50  Plan of care: The patient is accepted for admission to Med-surg  unit, at [facility].  Abscess area very superficial.  Does not need ENT at the moment per Dr. Lockie Mola.  He received vancomycin 1 g IVP, lorazepam 1 mg IM and diazepam 1 mg IV.  Author: Bobette Mo, MD 09/09/2022  Check [email or web address] for on-call coverage.  Nursing staff, Please call [facility] Admits & Consults System-Wide number on Amion as  soon as patient's arrival, so appropriate admitting provider can evaluate the pt.

## 2022-09-09 NOTE — ED Provider Notes (Addendum)
[place] EMERGENCY DEPARTMENT AT [facility] Provider Note   CSN: [id number] Arrival date & time: 09/09/22  1043     History  Chief Complaint  Patient presents with   Facial Swelling    [name] is a 62 y.o. male.  Level 5 caveat as patient unable to give history due to Down syndrome.  He has been on clindamycin for the last couple days for facial cellulitis.  Had a pustule at his left nose and then he started to have some swelling around that area in the left side of his face but now swelling is gotten worse including more of the face and the upper lip.  The history is provided by a caregiver and a relative.       Home Medications Prior to Admission medications   Medication Sig Start Date End Date Taking? Authorizing Provider  mupirocin ointment (BACTROBAN) 2 % Apply 1 Application topically 2 (two) times daily. 09/06/22  Yes [provider]  clindamycin (CLEOCIN) 300 MG capsule Take 1 capsule (300 mg total) by mouth 3 (three) times daily for 7 days. 09/07/22 09/14/22  Rancour, Jeannett Senior, MD  Clotrimazole-Betamethasone (LOTRISONE EX) Apply 1 application topically 2 (two) times daily. Apply to neck area.    [provider]  doxycycline (VIBRA-TABS) 100 MG tablet Take 100 mg by mouth 2 (two) times daily.    [provider]  [facility] EX) Apply 1 application topically 2 (two) times daily. Apply to both arms, hands, and legs.    [provider]  HYDROcodone-acetaminophen (NORCO/VICODIN) 5-325 MG per tablet Take 1 tablet by mouth 2 (two) times daily as needed for severe pain. 08/13/14   Tomasita Crumble, MD  hydrocortisone cream 1 % Apply 1 application topically at bedtime. Apply to face after bath.    [provider]  liver oil-zinc oxide (DESITIN) 40 % ointment Apply 1 application topically 3 (three) times daily. In between buttocks    [provider]  methenamine (HIPREX) 1 g tablet Take 1 g by mouth 2 (two)  times daily.    [provider]  montelukast (SINGULAIR) 10 MG tablet Take 10 mg by mouth daily.    [provider]  pantoprazole (PROTONIX) 20 MG tablet Take 20 mg by mouth 2 (two) times daily.     [provider]  triazolam (HALCION) 0.125 MG tablet Take 0.375 mg by mouth at bedtime as needed for sleep (prior to dental procedure).    [provider]      Allergies    Actifed cold-allergy [chlorpheniramine-phenylephrine], Amoxicillin, Keflex [cephalexin], Ketamine, and Penicillins    Review of Systems   Review of Systems  Physical Exam Updated Vital Signs BP 115/73   Pulse (!) 105   Temp 98 F (36.7 C) (Tympanic)   Resp 18   Ht  (1.473 m)   Wt 50.8 kg   SpO2 98%   BMI 23.41 kg/m  Physical Exam Vitals and nursing note reviewed.  Constitutional:      General: He is not in acute distress.    Appearance: He is well-developed.  HENT:     Head: Normocephalic and atraumatic.     Comments: Oropharynx is clear with no trismus or drooling, tongue is normal    Mouth/Throat:     Mouth: Mucous membranes are moist.  Eyes:     Extraocular Movements: Extraocular movements intact.     Conjunctiva/sclera: Conjunctivae normal.     Pupils: Pupils are equal, round, and reactive to  light.  Cardiovascular:     Rate and Rhythm: Normal rate and regular rhythm.     Heart sounds: No murmur heard. Pulmonary:     Effort: Pulmonary effort is normal. No respiratory distress.     Breath sounds: Normal breath sounds.  Abdominal:     Palpations: Abdomen is soft.     Tenderness: There is no abdominal tenderness.  Musculoskeletal:        General: No swelling.     Cervical back: Neck supple.  Skin:    General: Skin is warm and dry.     Capillary Refill: Capillary refill takes less than 2 seconds.     Findings: Erythema present.     Comments: Pustule at the left nare with surrounding redness and some mild fluctuance extending to the left upper lip area, left  side of the face does not appear to involve ocular area, extraocular movements are intact  Neurological:     Mental Status: He is alert.  Psychiatric:        Mood and Affect: Mood normal.     ED Results / Procedures / Treatments   Labs (all labs ordered are listed, but only abnormal results are displayed) Labs Reviewed  CBC WITH DIFFERENTIAL/PLATELET - Abnormal; Notable for the following components:      Result Value   RBC 4.17 (*)    Monocytes Absolute 1.4 (*)    All other components within normal limits  COMPREHENSIVE METABOLIC PANEL - Abnormal; Notable for the following components:   Glucose, Bld 104 (*)    Albumin 3.4 (*)    All other components within normal limits  LACTIC ACID, PLASMA    EKG None  Radiology CT Maxillofacial W Contrast  Result Date: 09/09/2022 CLINICAL DATA:  Nasal abscess infection. EXAM: CT MAXILLOFACIAL WITH CONTRAST TECHNIQUE: Multidetector CT imaging of the maxillofacial structures was performed with intravenous contrast. Multiplanar CT image reconstructions were also generated. RADIATION DOSE REDUCTION: This exam was performed according to the departmental dose-optimization program which includes automated exposure control, adjustment of the mA and/or kV according to patient size and/or use of iterative reconstruction technique. CONTRAST:  75mL OMNIPAQUE IOHEXOL 300 MG/ML  SOLN COMPARISON:  None Available. FINDINGS: Osseous: No fracture or mandibular dislocation. No destructive process. Orbits: Negative. No traumatic or inflammatory finding.1 Sinuses: Trace mucosal thickening in the bilateral maxillary sinuses. Paranasal sinuses are otherwise clear. Soft tissues: There is a 1.8 x 2.2 x 2.0 cm soft tissue abscess in the infranasal pre maxillary soft tissues slightly to the left of midline (series 2, image 57). Low-density regions are also seen along the columella, the anterior nasal septum (series 2, image 43), and along the nasal bridge, which may represent  additional regions of abscess or phlegmonous change. Limited intracranial: No significant or unexpected finding. IMPRESSION: 1.8 x 2.2 x 2.0 cm soft tissue abscess in the infranasal pre maxillary soft tissues slightly to the left of midline. Low-density regions are also seen along the columella, the anterior nasal septum, and along the nasal bridge, which may represent additional regions of abscess or phlegmonous change. Electronically Signed   By: Lorenza Cambridge M.D.   On: 09/09/2022 12:50    Procedures Procedures    Medications Ordered in ED Medications  vancomycin (VANCOCIN) IVPB 1000 mg/200 mL premix (1,000 mg Intravenous New Bag/Given 09/09/22 1237)  vancomycin (VANCOCIN) IVPB 1000 mg/200 mL premix (has no administration in time range)  LORazepam (ATIVAN) injection 1 mg (1 mg Intramuscular Given 09/09/22 1106)  diazepam (VALIUM) injection  2 mg (2 mg Intravenous Given 09/09/22 1210)  iohexol (OMNIPAQUE) 300 MG/ML solution 75 mL (75 mLs Intravenous Contrast Given 09/09/22 1232)  diazepam (VALIUM) injection 1 mg (1 mg Intravenous Given 09/09/22 1230)    ED Course/ Medical Decision Making/ A&P                             Medical Decision Making Amount and/or Complexity of Data Reviewed Labs: ordered. Radiology: ordered.  Risk Prescription drug management. Decision regarding hospitalization.   [name] is here with reevaluation of facial swelling.  Normal vitals.  No fever.  He has been on clindamycin the last couple days for facial cellulitis.  Sounds like he started with a pustule/bump under his left nostril a couple days ago and then he developed swelling around that area.  Swelling gotten worse over the last few days.  He now has some swelling that involves the left side of the cheek, left upper lip but oropharynx appears to be clear, no signs of dental process on exam either, he has a pustule in the left nare area that I suspect is the source for skin breakdown leading to this  infectious process.  I am sure there is a phlegmon in this process and he will need some IV antibiotics to treat this at this point.  Will try to get a CT scan to make sure there is no abscess but I feel like starting the least IV antibiotics and having him admitted and may be reevaluated for CT scan is reasonable as well.  Family concerned with his Down syndrome as he is not able to follow directions well or sit still for IVs and CT scans.  We will try some Ativan/Valium to see if we can achieve a CT here.  He does scratch at his face often and suspect that he scratched at his left nose and that is the nidus for infection.  He is not a diabetic.  Will get CBC, BMP, lactic acid, give IV vancomycin and admit.  Per my review and interpretation labs no significant anemia or electrolyte abnormality or kidney injury or leukocytosis.  CT scan shows soft tissue abscess 2 x 2 x 2 in the inferonasal premaxillary soft tissue slightly to the left of midline.  Overall this is very superficial and phlegmon numbness and change in.  Overall we will admit for IV antibiotics and further care.  This chart was dictated using voice recognition software.  Despite best efforts to proofread,  errors can occur which can change the documentation meaning.         Final Clinical Impression(s) / ED Diagnoses Final diagnoses:  Facial cellulitis    Rx / DC Orders ED Discharge Orders     None         Virgina Norfolk, DO 09/09/22 1256    Virgina Norfolk, DO 09/09/22 1323

## 2022-09-09 NOTE — ED Notes (Signed)
Transport with patient to CT scan on pulse ox.  Valium given for restlessness.  Tolerated well  Pulse ox remain between 92 and 97 % on room air

## 2022-09-09 NOTE — ED Notes (Signed)
Called [facility] -- informed that the patient Bed Assignment is ready 

## 2022-09-09 NOTE — Progress Notes (Signed)
Pharmacy Antibiotic Note  [name] is a 62 y.o. male admitted on 09/09/2022 with cellulitis.  Pharmacy has been consulted for vancomycin dosing. Pt is afebrile and WBC is WNL. Scr is WNL and lactic acid is <2.   Plan: Vancomycin 1g IV Q24H F/u renal fxn, C&S, clinical status and peak/trough at SS  Weight: 50.8 kg (112 lb)  Temp (24hrs), Avg:98 F (36.7 C), Min:98 F (36.7 C), Max:98 F (36.7 C)  Recent Labs  Lab 09/07/22 1146  WBC 8.3  CREATININE 0.74    CrCl cannot be calculated (Unknown ideal weight.).    Allergies  Allergen Reactions   Actifed Cold-Allergy [Chlorpheniramine-Phenylephrine]     unknown   Amoxicillin     unknown   Keflex [Cephalexin]     unknown   Ketamine Other (See Comments)    Drops respiration down to a dangerous level.    Penicillins     unknown    Antimicrobials this admission: Vanc 4/22>>  Dose adjustments this admission: N/A  Microbiology results: Pending  Thank you for allowing pharmacy to be a part of this patient's care.  [name], [name] 09/09/2022 11:16 AM

## 2022-09-09 NOTE — ED Triage Notes (Signed)
States present for swelling to left side face.  Was here Saturday treated with antibiotics.   States swelling is getting worse.

## 2022-09-09 NOTE — Plan of Care (Signed)
°  Problem: Clinical Measurements: °Goal: Will remain free from infection °Outcome: Progressing °  °Problem: Nutrition: °Goal: Adequate nutrition will be maintained °Outcome: Progressing °  °Problem: Coping: °Goal: Level of anxiety will decrease °Outcome: Progressing °  °

## 2022-09-10 DIAGNOSIS — L0201 Cutaneous abscess of face: Secondary | ICD-10-CM | POA: Diagnosis not present

## 2022-09-10 DIAGNOSIS — L03211 Cellulitis of face: Secondary | ICD-10-CM | POA: Diagnosis not present

## 2022-09-10 LAB — COMPREHENSIVE METABOLIC PANEL
ALT: 12 U/L (ref 0–44)
AST: 19 U/L (ref 15–41)
Albumin: 2.7 g/dL — ABNORMAL LOW (ref 3.5–5.0)
Alkaline Phosphatase: 58 U/L (ref 38–126)
Anion gap: 9 (ref 5–15)
BUN: 8 mg/dL (ref 6–20)
CO2: 26 mmol/L (ref 22–32)
Calcium: 8.2 mg/dL — ABNORMAL LOW (ref 8.9–10.3)
Chloride: 102 mmol/L (ref 98–111)
Creatinine, Ser: 0.69 mg/dL (ref 0.61–1.24)
GFR, Estimated: >60 mL/min (ref >60)
Glucose, Bld: 100 mg/dL — ABNORMAL HIGH (ref 70–99)
Potassium: 3.7 mmol/L (ref 3.5–5.1)
Sodium: 137 mmol/L (ref 135–145)
Total Bilirubin: 0.7 mg/dL (ref 0.3–1.2)
Total Protein: 6.3 g/dL — ABNORMAL LOW (ref 6.5–8.1)

## 2022-09-10 LAB — CBC
HCT: 39.2 % (ref 39.0–52.0)
Hemoglobin: 12.9 g/dL — ABNORMAL LOW (ref 13.0–17.0)
MCH: 32.7 pg (ref 26.0–34.0)
MCHC: 32.9 g/dL (ref 30.0–36.0)
MCV: 99.5 fL (ref 80.0–100.0)
Platelets: 244 10*3/uL (ref 150–400)
RBC: 3.94 MIL/uL — ABNORMAL LOW (ref 4.22–5.81)
RDW: 12.8 % (ref 11.5–15.5)
WBC: 5.8 10*3/uL (ref 4.0–10.5)
nRBC: 0 % (ref 0.0–0.2)

## 2022-09-10 LAB — HIV ANTIBODY (ROUTINE TESTING W REFLEX): HIV Screen 4th Generation wRfx: NONREACTIVE

## 2022-09-10 MED ORDER — MUPIROCIN 2 % EX OINT
TOPICAL_OINTMENT | Freq: Two times a day (BID) | CUTANEOUS | Status: DC
  Filled 2022-09-10: qty 22

## 2022-09-10 MED ORDER — ACETAMINOPHEN 325 MG PO TABS: 650.0000 mg | ORAL_TABLET | Freq: Four times a day (QID) | ORAL | Status: DC | PRN

## 2022-09-10 MED ORDER — VANCOMYCIN HCL IN DEXTROSE 1-5 GM/200ML-% IV SOLN
1000.0000 mg | INTRAVENOUS | Status: AC
  Administered 2022-09-10 – 2022-09-11 (×2): 1000 mg via INTRAVENOUS
  Filled 2022-09-10 (×2): qty 200

## 2022-09-10 MED ORDER — ACETAMINOPHEN 325 MG PO TABS
650.0000 mg | ORAL_TABLET | Freq: Three times a day (TID) | ORAL | Status: DC
  Administered 2022-09-10 – 2022-09-12 (×7): 650 mg via ORAL
  Filled 2022-09-10 (×7): qty 2

## 2022-09-10 MED ORDER — LINEZOLID 600 MG PO TABS
600.0000 mg | ORAL_TABLET | Freq: Two times a day (BID) | ORAL | Status: DC
  Administered 2022-09-10 – 2022-09-12 (×4): 600 mg via ORAL
  Filled 2022-09-10 (×4): qty 1

## 2022-09-10 NOTE — Progress Notes (Signed)
[name] with [name] called to obtain information about the patient. He is not listed on emergency contacts but the patient's sister [name] gave me permission to talk to him.

## 2022-09-10 NOTE — Consult Note (Signed)
[facility]    Date of Admission:  09/09/2022     Reason for Consult: facial cellulitis    Referring Provider: Jerelene Redden    Abx: 4/22-c linezolid 4/22-c vanc  4/22 clinda          Assessment: 62 y.o. male down syndrome admitted 4/22 for progressive facial cellulitis despite oral abx  This is staph aureus cellulitis/abscess Some improvement today on iv abx and linezolid combination for toxin inhibition  Ct reviewed small abscess. Ent consulted and will see if any surgical intervention needed     Plan: Continue linezolid/vanc Consider discussing with ent for the abscess visualized  Tomorrow if continued improvement could stop vanc, and finish treatment course with linezolid alone Discussed with primary team    I spent more than 80 minute reviewing data/chart, and coordinating care, providing direct face to face time providing counseling/discussing diagnostics/treatment plan with patient and treatment team      ------------------------------------------------ Principal Problem:   Facial cellulitis    HPI: [name] is a 62 y.o. male down syndrome admitted 4/22 for progressive facial cellulitis despite oral abx  Hx via chart and patient's brother and sister who were available at bedside  Patient 3 days prior to admission developed a pimple at the nasal nares. Was seen in ed 1 day after symptoms onset, and tried to lance but no pus. Given clinda. But readmitted 4/22 after swelling/pain/redness gotten worse  Afebrile no leukocytosis this admission Given iv vanc and clinda  No blood dx obtained Ct face does show small abscess   Today the swelling had gotten better and the region around the opening had scabbed over, but still no purulence  Per family patient appears more comfortable today, able to eat   History reviewed. No pertinent family history.  Social History   Tobacco Use   Smoking status:  Never  Substance Use Topics   Alcohol use: No    Allergies  Allergen Reactions   Actifed Cold-Allergy [Chlorpheniramine-Phenylephrine] Other (See Comments)    unknown   Amoxicillin Other (See Comments)    unknown   Keflex [Cephalexin] Other (See Comments)    unknown   Ketamine Other (See Comments)    Drops respiration down to a dangerous level.    Penicillins Other (See Comments)    unknown    Review of Systems: ROS All Other ROS was negative, except mentioned above   Past Medical History:  Diagnosis Date   Down syndrome    Dysphagia    Esophageal ulcer        Scheduled Meds:  acetaminophen  650 mg Oral TID   enoxaparin (LOVENOX) injection  40 mg Subcutaneous Q24H   QUEtiapine  12.5 mg Oral QHS   Continuous Infusions:  sodium chloride 125 mL/hr at 09/10/22 0633   vancomycin     PRN Meds:.   OBJECTIVE: Blood pressure 107/73, pulse 84, temperature (!) 97.5 F (36.4 C), temperature source Oral, resp. rate 17, height  (1.473 m), weight 50.8 kg, SpO2 97 %.  Physical Exam General/constitutional: no distress, pleasant, nonverbal; tracking and interactive HEENT/skin; facial swelling around mouth upper lip worse with erythema and eschar just outside of nose; per; conj clear; eomi Neck supple CV: rrr no mrg Lungs: clear to auscultation, normal respiratory effort Abd: Soft, Nontender Ext: no edema Neuro: nonfocal MSK: no peripheral joint swelling/tenderness/warmth; back spines nontender     Lab Results Lab Results  Component Value Date  WBC 5.8 09/10/2022   HGB 12.9 (L) 09/10/2022   HCT 39.2 09/10/2022   MCV 99.5 09/10/2022   PLT 244 09/10/2022    Lab Results  Component Value Date   CREATININE 0.69 09/10/2022   BUN 8 09/10/2022   NA 137 09/10/2022   K 3.7 09/10/2022   CL 102 09/10/2022   CO2 26 09/10/2022    Lab Results  Component Value Date   ALT 12 09/10/2022   AST 19 09/10/2022   ALKPHOS 58 09/10/2022   BILITOT 0.7 09/10/2022       Microbiology: Recent Results (from the past 240 hour(s))  MRSA Next Gen by PCR, Nasal     Status: None   Collection Time: 09/09/22  4:19 PM   Specimen: Nasal Mucosa; Nasal Swab  Result Value Ref Range Status   MRSA by PCR Next Gen NOT DETECTED NOT DETECTED Final    Comment: (NOTE) The GeneXpert MRSA Assay (FDA approved for NASAL specimens only), is one component of a comprehensive MRSA colonization surveillance program. It is not intended to diagnose MRSA infection nor to guide or monitor treatment for MRSA infections. Test performance is not FDA approved in patients less than 62 years old. Performed at [facility], [address], [place], [place] [address]      Serology:    Imaging: If present, new imagings (plain films, ct scans, and mri) have been personally visualized and interpreted; radiology reports have been reviewed. Decision making incorporated into the Impression / Recommendations.  4/22 ct maxillofacial 1.8 x 2.2 x 2.0 cm soft tissue abscess in the infranasal pre maxillary soft tissues slightly to the left of midline. Low-density regions are also seen along the columella, the anterior nasal septum, and along the nasal bridge, which may represent additional regions of abscess or phlegmonous change.     Raymondo Band, MD [facility] [phone] pager    09/10/2022, 10:14 AM

## 2022-09-10 NOTE — Progress Notes (Signed)
Nonpitting swelling noted to right hand. Long sleeved shirt pulled up and unwrapped IV. Swelling noted up patient's arm  to above right elbow. Very firm and cool to touch. NS was infusing during the night at 125cc/hr. IV consult placed for new IV. Attempted to keep arm raised on pillow but patient does not have cognition to follow directions.

## 2022-09-10 NOTE — Plan of Care (Signed)

## 2022-09-10 NOTE — Progress Notes (Signed)
PROGRESS NOTE    Evrett Hakim  WUJ:[id number] DOB: 1960-09-01 DOA: 09/09/2022 PCP: Oneita Hurt, No   Brief Narrative: Ren Aspinall is a 62 y.o. male with medical history significant of Down syndrome, autism low functioning admitted with facial swelling and redness.  History obtained from the patient's sister.  Patient came to the ER few days ago from the group home he was given clindamycin.  Since swelling and the redness has not been getting better they brought him back to the hospital.  It started as a pustule in his left nose with increased swelling and erythema around the upper lip and left side of his nose.  He was given vancomycin lorazepam and diazepam in the ER.   ED Course: He was given vancomycin. Blood pressure 103/70 pulse is 100 temperature 98 -99% on room air Normal white count normal renal function Maxillofacial CT- 1.8 x 2.2 x 2.0 cm soft tissue abscess in the infranasal pre maxillary soft tissues slightly to the left of midline. Low-density regions are also seen along the columella, the anterior nasal septum, and along the nasal bridge, which may represent additional regions of abscess or phlegmonous change.  Assessment & Plan:   Principal Problem:   Facial cellulitis   #1 left facial cellulitis with abscess-patient admitted with erythema swelling to the left upper lips left side of his nose started as a pustule.     CT -1.8 x 2.2 x 2.0 cm soft tissue abscess in the infranasal pre maxillary soft tissues slightly to the left of midline. Low-density regions are also seen along the columella, the anterior nasal septum, and along the nasal bridge, which may represent additional regions of abscess or phlegmonous change. DW ID AND ENT WILL see patient today. On vancomycin/zyvox  #2 Down syndrome/autism/mental retardation lives in a group home. Continue Seroquel at night for sleep.   #3 goals of care discussed with patient's sister he is-DNR.   Estimated body mass  index is 23.41 kg/m as calculated from the following:   Height as of this encounter: 4\' 10"  (1.473 m).   Weight as of this encounter: 50.8 kg.  DVT prophylaxis: lovenox Code Status:dnr Family Communication: dw sister Disposition Plan:  Status is: Inpatient    Consultants: ent id  Procedures:none Antimicrobials:  Anti-infectives (From admission, onward)    Start     Dose/Rate Route Frequency Ordered Stop   09/10/22 2200  linezolid (ZYVOX) tablet 600 mg        600 mg Oral Every 12 hours 09/10/22 1017     09/10/22 1800  vancomycin (VANCOCIN) IVPB 1000 mg/200 mL premix        1,000 mg 200 mL/hr over 60 Minutes Intravenous Every 24 hours 09/10/22 0846     09/10/22 1200  vancomycin (VANCOCIN) IVPB 1000 mg/200 mL premix  Status:  Discontinued        1,000 mg 200 mL/hr over 60 Minutes Intravenous Every 24 hours 09/09/22 1306 09/09/22 1742   09/09/22 2200  linezolid (ZYVOX) tablet 600 mg  Status:  Discontinued        600 mg Oral Every 12 hours 09/09/22 1713 09/10/22 0845   09/09/22 1130  vancomycin (VANCOCIN) IVPB 1000 mg/200 mL premix        1,000 mg 200 mL/hr over 60 Minutes Intravenous  Once 09/09/22 1115 09/09/22 1340        Subjective:  He slept well overnight till the lab came to draw blood on him at 4 AM. His IV was infiltrated overnight he  had a IV in his right hand had a longsleeve shirt so he would not pull the IV.  IV has been switched to his left hand today. Objective: Vitals:   09/09/22 1831 09/09/22 2052 09/10/22 0508 09/10/22 0509  BP: 116/78 (!) 103/91 107/73 107/73  Pulse: 91 87 80 84  Resp:  Temp:    (!) 97.5 F (36.4 C)  TempSrc:    Oral  SpO2:   97% 97%  Weight:      Height:        Intake/Output Summary (Last 24 hours) at 09/10/2022 1029 Last data filed at 09/10/2022 0400 Gross per 24 hour  Intake 1507.46 ml  Output --  Net 1507.46 ml   Filed Weights   09/09/22 1050  Weight: 50.8 kg    Examination:  General exam: Appears  in nad    Respiratory system: Clear to auscultation. Respiratory effort normal. Cardiovascular system: S1 & S2 heard, RRR. No JVD, murmurs, rubs, gallops or clicks. No pedal edema. Gastrointestinal system: Abdomen is nondistended, soft and nontender. No organomegaly or masses felt. Normal bowel sounds heard. Central nervous system: Alert and oriented. No focal neurological deficits. Extremities: rue edema  Skin: No rashes, lesions or ulcers Psychiatry: Judgement and insight appear normal. Mood & affect appropriate.     Data Reviewed: I have personally reviewed following labs and imaging studies  CBC: Recent Labs  Lab 09/07/22 1146 09/09/22 1144 09/10/22 0429  WBC 8.3 9.8 5.8  NEUTROABS 5.5 6.9  --   HGB 14.0 13.8 12.9*  HCT 42.3 41.1 39.2  MCV 99.3 98.6 99.5  PLT 226 230 244   Basic Metabolic Panel: Recent Labs  Lab 09/07/22 1146 09/09/22 1144 09/10/22 0429  NA 139 137 137  K 4.0 3.8 3.7  CL 103 101 102  CO2 GLUCOSE 105* 104* 100*  BUN CREATININE 0.74 0.70 0.69  CALCIUM 9.0 9.1 8.2*   GFR: Estimated Creatinine Clearance: 63.8 mL/min (by C-G formula based on SCr of 0.69 mg/dL). Liver Function Tests: Recent Labs  Lab 09/07/22 1146 09/09/22 1144 09/10/22 0429  AST ALT ALKPHOS 60 61 58  BILITOT 0.6 0.5 0.7  PROT 6.8 6.8 6.3*  ALBUMIN 3.6 3.4* 2.7*   No results for input(s): "LIPASE", "AMYLASE" in the last 168 hours. No results for input(s): "AMMONIA" in the last 168 hours. Coagulation Profile: No results for input(s): "INR", "PROTIME" in the last 168 hours. Cardiac Enzymes: No results for input(s): "CKTOTAL", "CKMB", "CKMBINDEX", "TROPONINI" in the last 168 hours. BNP (last 3 results) No results for input(s): "PROBNP" in the last 8760 hours. HbA1C: No results for input(s): "HGBA1C" in the last 72 hours. CBG: No results for input(s): "GLUCAP" in the last 168 hours. Lipid Profile: No results for input(s): "CHOL", "HDL",  "LDLCALC", "TRIG", "CHOLHDL", "LDLDIRECT" in the last 72 hours. Thyroid Function Tests: No results for input(s): "TSH", "T4TOTAL", "FREET4", "T3FREE", "THYROIDAB" in the last 72 hours. Anemia Panel: No results for input(s): "VITAMINB12", "FOLATE", "FERRITIN", "TIBC", "IRON", "RETICCTPCT" in the last 72 hours. Sepsis Labs: Recent Labs  Lab 09/09/22 1144  LATICACIDVEN 1.9    Recent Results (from the past 240 hour(s))  MRSA Next Gen by PCR, Nasal     Status: None   Collection Time: 09/09/22  4:19 PM   Specimen: Nasal Mucosa; Nasal Swab  Result Value Ref Range Status   MRSA by PCR Next Gen NOT DETECTED  NOT DETECTED Final    Comment: (NOTE) The GeneXpert MRSA Assay (FDA approved for NASAL specimens only), is one component of a comprehensive MRSA colonization surveillance program. It is not intended to diagnose MRSA infection nor to guide or monitor treatment for MRSA infections. Test performance is not FDA approved in patients less than 66 years old. Performed at [facility], [address] [place], [place] [address]          Radiology Studies: CT Maxillofacial W Contrast  Result Date: 09/09/2022 CLINICAL DATA:  Nasal abscess infection. EXAM: CT MAXILLOFACIAL WITH CONTRAST TECHNIQUE: Multidetector CT imaging of the maxillofacial structures was performed with intravenous contrast. Multiplanar CT image reconstructions were also generated. RADIATION DOSE REDUCTION: This exam was performed according to the departmental dose-optimization program which includes automated exposure control, adjustment of the mA and/or kV according to patient size and/or use of iterative reconstruction technique. CONTRAST:  75mL OMNIPAQUE IOHEXOL 300 MG/ML  SOLN COMPARISON:  None Available. FINDINGS: Osseous: No fracture or mandibular dislocation. No destructive process. Orbits: Negative. No traumatic or inflammatory finding.1 Sinuses: Trace mucosal thickening in the bilateral maxillary  sinuses. Paranasal sinuses are otherwise clear. Soft tissues: There is a 1.8 x 2.2 x 2.0 cm soft tissue abscess in the infranasal pre maxillary soft tissues slightly to the left of midline (series 2, image 57). Low-density regions are also seen along the columella, the anterior nasal septum (series 2, image 43), and along the nasal bridge, which may represent additional regions of abscess or phlegmonous change. Limited intracranial: No significant or unexpected finding. IMPRESSION: 1.8 x 2.2 x 2.0 cm soft tissue abscess in the infranasal pre maxillary soft tissues slightly to the left of midline. Low-density regions are also seen along the columella, the anterior nasal septum, and along the nasal bridge, which may represent additional regions of abscess or phlegmonous change. Electronically Signed   By: Lorenza Cambridge M.D.   On: 09/09/2022 12:50        Scheduled Meds:  acetaminophen  650 mg Oral TID   enoxaparin (LOVENOX) injection  40 mg Subcutaneous Q24H   linezolid  600 mg Oral Q12H   QUEtiapine  12.5 mg Oral QHS   Continuous Infusions:  sodium chloride 125 mL/hr at 09/10/22 0633   vancomycin       LOS: 1 day   Time spent: 35 min Alwyn Ren, MD 09/10/2022, 10:29 AM

## 2022-09-10 NOTE — Progress Notes (Signed)
Pharmacy Antibiotic Note  [name] is a 62 y.o. male admitted on 09/09/2022 with cellulitis. CT with soft tissue abscess in the infranasal pre maxillary soft tissues slightly to the left of midline. Patient transitioned from IV vancomycin to PO linezolid. Pharmacy has been consulted to continue vancomycin dosing for now.  Plan: -Resume vancomycin 1 g IV q24h -Follow for transition back to PO antibiotics as indicated  Height:  (147.3 cm) Weight: 50.8 kg (112 lb) IBW/kg (Calculated) : 45.4  Temp (24hrs), Avg:97.8 F (36.6 C), Min:97.5 F (36.4 C), Max:98 F (36.7 C)  Recent Labs  Lab 09/07/22 1146 09/09/22 1144 09/10/22 0429  WBC 8.3 9.8 5.8  CREATININE 0.74 0.70 0.69  LATICACIDVEN  --  1.9  --      Estimated Creatinine Clearance: 63.8 mL/min (by C-G formula based on SCr of 0.69 mg/dL).    Allergies  Allergen Reactions   Actifed Cold-Allergy [Chlorpheniramine-Phenylephrine]     unknown   Amoxicillin     unknown   Keflex [Cephalexin]     unknown   Ketamine Other (See Comments)    Drops respiration down to a dangerous level.    Penicillins     unknown    Antimicrobials this admission: Vanc 4/22>> Linezolid 4/22 >> 4/23  Dose adjustments this admission: N/A   Thank you for allowing pharmacy to be a part of this patient's care.   Pricilla Riffle, PharmD, BCPS Clinical Pharmacist 09/10/2022 8:50 AM

## 2022-09-10 NOTE — Consult Note (Signed)
Reason for Consult: Facial cellulitis  HPI:  [name] is an 62 y.o. male who was admitted yesterday for treatment of his facial cellulitis.  The patient was treated with clindamycin for several days prior to his admission.  His infection started with a small pustule on his left nose, which progressed to involve most of his left upper lip.  His CT scan showed small abscesses in the infranasal pre maxillary soft tissue area. He is currently on Linezolid, Vancomycin, and Bactroban.  Past Medical History:  Diagnosis Date   Down syndrome    Dysphagia    Esophageal ulcer     History reviewed. No pertinent surgical history.  History reviewed. No pertinent family history.  Social History:  reports that he has never smoked. He does not have any smokeless tobacco history on file. He reports that he does not drink alcohol. No history on file for drug use.  Allergies:  Allergies  Allergen Reactions   Actifed Cold-Allergy [Chlorpheniramine-Phenylephrine] Other (See Comments)    unknown   Amoxicillin Other (See Comments)    unknown   Keflex [Cephalexin] Other (See Comments)    unknown   Ketamine Other (See Comments)    Drops respiration down to a dangerous level.    Penicillins Other (See Comments)    unknown    Prior to Admission medications   Medication Sig Start Date End Date Taking? Authorizing Provider  mupirocin ointment (BACTROBAN) 2 % Apply 1 Application topically 2 (two) times daily. 09/06/22  Yes [provider]  clindamycin (CLEOCIN) 300 MG capsule Take 1 capsule (300 mg total) by mouth 3 (three) times daily for 7 days. 09/07/22 09/14/22  Rancour, Jeannett Senior, MD  Clotrimazole-Betamethasone (LOTRISONE EX) Apply 1 application topically 2 (two) times daily. Apply to neck area.    [provider]  doxycycline (VIBRA-TABS) 100 MG tablet Take 100 mg by mouth 2 (two) times daily.    [provider]  Emollient [facility] EX) Apply 1 application topically 2  (two) times daily. Apply to both arms, hands, and legs.    [provider]  HYDROcodone-acetaminophen (NORCO/VICODIN) 5-325 MG per tablet Take 1 tablet by mouth 2 (two) times daily as needed for severe pain. 08/13/14   Tomasita Crumble, MD  hydrocortisone cream 1 % Apply 1 application topically at bedtime. Apply to face after bath.    [provider]  liver oil-zinc oxide (DESITIN) 40 % ointment Apply 1 application topically 3 (three) times daily. In between buttocks    [provider]  methenamine (HIPREX) 1 g tablet Take 1 g by mouth 2 (two) times daily.    [provider]  montelukast (SINGULAIR) 10 MG tablet Take 10 mg by mouth daily.    [provider]  pantoprazole (PROTONIX) 20 MG tablet Take 20 mg by mouth 2 (two) times daily.     [provider]  triazolam (HALCION) 0.125 MG tablet Take 0.375 mg by mouth at bedtime as needed for sleep (prior to dental procedure).    [provider]    Medications: I have reviewed the patient's current medications. Scheduled:  acetaminophen  650 mg Oral TID   enoxaparin (LOVENOX) injection  40 mg Subcutaneous Q24H   linezolid  600 mg Oral Q12H   mupirocin ointment   Nasal BID   QUEtiapine  12.5 mg Oral QHS   Continuous:  vancomycin 1,000 mg (09/10/22 1705)   PRN:  Results for orders placed or performed during the hospital encounter of 09/09/22 (from the past  48 hour(s))  CBC with Differential     Status: Abnormal   Collection Time: 09/09/22 11:44 AM  Result Value Ref Range   WBC 9.8 4.0 - 10.5 K/uL   RBC 4.17 (L) 4.22 - 5.81 MIL/uL   Hemoglobin 13.8 13.0 - 17.0 g/dL   HCT 16.1 09.6 - 04.5 %   MCV 98.6 80.0 - 100.0 fL   MCH 33.1 26.0 - 34.0 pg   MCHC 33.6 30.0 - 36.0 g/dL   RDW 40.9 81.1 - 91.4 %   Platelets 230 150 - 400 K/uL   nRBC 0.0 0.0 - 0.2 %   Neutrophils Relative % 71 %   Neutro Abs 6.9 1.7 - 7.7 K/uL   Lymphocytes Relative 13 %   Lymphs Abs 1.3 0.7 - 4.0 K/uL    Monocytes Relative 14 %   Monocytes Absolute 1.4 (H) 0.1 - 1.0 K/uL   Eosinophils Relative 1 %   Eosinophils Absolute 0.1 0.0 - 0.5 K/uL   Basophils Relative 1 %   Basophils Absolute 0.1 0.0 - 0.1 K/uL   Immature Granulocytes 0 %   Abs Immature Granulocytes 0.04 0.00 - 0.07 K/uL    Comment: Performed at [facility], [address] [place], [place] [address]  Comprehensive metabolic panel     Status: Abnormal   Collection Time: 09/09/22 11:44 AM  Result Value Ref Range   Sodium 137 135 - 145 mmol/L   Potassium 3.8 3.5 - 5.1 mmol/L   Chloride 101 98 - 111 mmol/L   CO2 28 22 - 32 mmol/L   Glucose, Bld 104 (H) 70 - 99 mg/dL    Comment: Glucose reference range applies only to samples taken after fasting for at least 8 hours.   BUN 11 6 - 20 mg/dL   Creatinine, Ser 6.21 0.61 - 1.24 mg/dL   Calcium 9.1 8.9 - 30.8 mg/dL   Total Protein 6.8 6.5 - 8.1 g/dL   Albumin 3.4 (L) 3.5 - 5.0 g/dL   AST 17 15 - 41 U/L   ALT 12 0 - 44 U/L   Alkaline Phosphatase 61 38 - 126 U/L   Total Bilirubin 0.5 0.3 - 1.2 mg/dL   GFR, Estimated >65 >78 mL/min    Comment: (NOTE) Calculated using the CKD-EPI Creatinine Equation (2021)    Anion gap 8 5 - 15    Comment: Performed at [facility], [address] [place], [place] [address]  Lactic acid, plasma     Status: None   Collection Time: 09/09/22 11:44 AM  Result Value Ref Range   Lactic Acid, Venous 1.9 0.5 - 1.9 mmol/L    Comment: Performed at [facility], [address] [place], [place] [address]  MRSA Next Gen by PCR, Nasal     Status: None   Collection Time: 09/09/22  4:19 PM   Specimen: Nasal Mucosa; Nasal Swab  Result Value Ref Range   MRSA by PCR Next Gen NOT DETECTED NOT DETECTED    Comment: (NOTE) The GeneXpert MRSA Assay (FDA approved for NASAL specimens only), is one component of a comprehensive MRSA colonization surveillance program. It is not intended to diagnose MRSA  infection nor to guide or monitor treatment for MRSA infections. Test performance is not FDA approved in patients less than 89 years old. Performed at [facility], [address] [place], [place] [address]   HIV Antibody (routine testing w rflx)     Status: None   Collection Time: 09/10/22  4:29 AM  Result Value Ref Range   HIV Screen 4th Generation wRfx Non Reactive Non Reactive    Comment: Performed at [facility], [address] [place], [place] [address]  CBC     Status: Abnormal   Collection Time: 09/10/22  4:29 AM  Result Value Ref Range   WBC 5.8 4.0 - 10.5 K/uL   RBC 3.94 (L) 4.22 - 5.81 MIL/uL   Hemoglobin 12.9 (L) 13.0 - 17.0 g/dL   HCT 60.4 54.0 - 98.1 %   MCV 99.5 80.0 - 100.0 fL   MCH 32.7 26.0 - 34.0 pg   MCHC 32.9 30.0 - 36.0 g/dL   RDW 19.1 47.8 - 29.5 %   Platelets 244 150 - 400 K/uL   nRBC 0.0 0.0 - 0.2 %    Comment: Performed at [facility], [address], [place], [place] [address]  Comprehensive metabolic panel     Status: Abnormal   Collection Time: 09/10/22  4:29 AM  Result Value Ref Range   Sodium 137 135 - 145 mmol/L   Potassium 3.7 3.5 - 5.1 mmol/L   Chloride 102 98 - 111 mmol/L   CO2 26 22 - 32 mmol/L   Glucose, Bld 100 (H) 70 - 99 mg/dL    Comment: Glucose reference range applies only to samples taken after fasting for at least 8 hours.   BUN 8 6 - 20 mg/dL   Creatinine, Ser 8.65 0.61 - 1.24 mg/dL   Calcium 8.2 (L) 8.9 - 10.3 mg/dL   Total Protein 6.3 (L) 6.5 - 8.1 g/dL   Albumin 2.7 (L) 3.5 - 5.0 g/dL   AST 19 15 - 41 U/L   ALT 12 0 - 44 U/L   Alkaline Phosphatase 58 38 - 126 U/L   Total Bilirubin 0.7 0.3 - 1.2 mg/dL   GFR, Estimated >78 >46 mL/min    Comment: (NOTE) Calculated using the CKD-EPI Creatinine Equation (2021)    Anion gap 9 5 - 15    Comment: Performed at [facility], [address], [place], [place] [address]    CT Maxillofacial W Contrast  Result Date:  09/09/2022 CLINICAL DATA:  Nasal abscess infection. EXAM: CT MAXILLOFACIAL WITH CONTRAST TECHNIQUE: Multidetector CT imaging of the maxillofacial structures was performed with intravenous contrast. Multiplanar CT image reconstructions were also generated. RADIATION DOSE REDUCTION: This exam was performed according to the departmental dose-optimization program which includes automated exposure control, adjustment of the mA and/or kV according to patient size and/or use of iterative reconstruction technique. CONTRAST:  75mL OMNIPAQUE IOHEXOL 300 MG/ML  SOLN COMPARISON:  None Available. FINDINGS: Osseous: No fracture or mandibular dislocation. No destructive process. Orbits: Negative. No traumatic or inflammatory finding.1 Sinuses: Trace mucosal thickening in the bilateral maxillary sinuses. Paranasal sinuses are otherwise clear. Soft tissues: There is a 1.8 x 2.2 x 2.0 cm soft tissue abscess in the infranasal pre maxillary soft tissues slightly to the left of midline (series 2, image 57). Low-density regions are also seen along the columella, the anterior nasal septum (series 2, image 43), and along the nasal bridge, which may represent additional regions of abscess or phlegmonous change. Limited intracranial: No significant or unexpected finding. IMPRESSION: 1.8 x 2.2 x 2.0 cm soft tissue abscess in the infranasal pre maxillary soft tissues slightly to the left of midline. Low-density regions are also seen along the columella, the anterior nasal septum, and along the nasal bridge, which may represent additional regions of abscess  or phlegmonous change. Electronically Signed   By: Lorenza Cambridge M.D.   On: 09/09/2022 12:50    Review of systems Unable to perform.  The patient has Down syndrome and is noncommunicative.  Blood pressure 107/73, pulse 84, temperature (!) 97.5 F (36.4 C), temperature source Oral, resp. rate 17, height 4\' 10"  (1.473 m), weight 50.8 kg, SpO2 97 %. General appearance: alert and  syndromic Head: Normocephalic, without obvious abnormality, atraumatic Ears: Examination of the ears shows normal auricles and external auditory canals bilaterally.  Nose: Nasal examination with edematous mucosa. Face: Significant induration and erythema around the upper lip. Mouth: Oral cavity examination shows no mucosal abnormality. No significant trismus is noted.  Neck: The trachea is midline. The thyroid is not significantly enlarged.    Assessment/Plan: Upper lip/facial cellulitis, with multiple small abscesses. -Agree with IV antibiotics.. -Nonoperative for now.  Will judged patient's clinical response based on the antibiotic treatment. -Will follow.  Keasia Dubose W Irene Mitcham 09/10/2022, 6:13 PM

## 2022-09-11 ENCOUNTER — Other Ambulatory Visit (HOSPITAL_COMMUNITY): Payer: Self-pay

## 2022-09-11 DIAGNOSIS — L03211 Cellulitis of face: Secondary | ICD-10-CM | POA: Diagnosis not present

## 2022-09-11 NOTE — Progress Notes (Signed)
Subjective: No issues overnight.  The upper lip swelling has decreased.  Objective: Vital signs in last 24 hours: Temp:  [97.6 F (36.4 C)-97.7 F (36.5 C)] 97.7 F (36.5 C) (04/24 0627) Pulse Rate:  [76-99] 76 (04/24 0627) Resp:  [16-18] 16 (04/24 0627) BP: (97-118)/(76-98) 97/76 (04/24 1610)  Physical exam: General appearance: alert and syndromic Head: Normocephalic, without obvious abnormality, atraumatic Ears: Examination of the ears shows normal auricles and external auditory canals bilaterally.  Nose: Nasal examination with edematous mucosa. Face: Induration and erythema around the upper lip.The severity has decreased. Mouth: Oral cavity examination shows no mucosal abnormality. No significant trismus is noted.  Neck: The trachea is midline. The thyroid is not significantly enlarged.   Recent Labs    09/09/22 1144 09/10/22 0429  WBC 9.8 5.8  HGB 13.8 12.9*  HCT 41.1 39.2  PLT 230 244   Recent Labs    09/09/22 1144 09/10/22 0429  NA 137 137  K 3.8 3.7  CL 101 102  CO2 28 26  GLUCOSE 104* 100*  BUN 11 8  CREATININE 0.70 0.69  CALCIUM 9.1 8.2*    Medications: I have reviewed the patient's current medications. Scheduled:  acetaminophen  650 mg Oral TID   enoxaparin (LOVENOX) injection  40 mg Subcutaneous Q24H   linezolid  600 mg Oral Q12H   mupirocin ointment   Nasal BID   QUEtiapine  12.5 mg Oral QHS   Continuous:  vancomycin Stopped (09/10/22 1811)   PRN:  Assessment/Plan: Upper lip/facial cellulitis, with multiple small abscesses. -  Continue with antibiotics as per ID. -  In light of his clinical improvement, his infection likely can be treated medically without any surgical intervention. -  Follow-up as needed.   LOS: 2 days   [name] W [name] 09/11/2022, 10:58 AM

## 2022-09-11 NOTE — TOC Benefit Eligibility Note (Signed)
Patient Product/process development scientist completed.    The patient is currently admitted and upon discharge could be taking linezolid (Zyvox) 600 mg tabets.  The current 14 day co-pay is $0.00.   The patient is insured through [employer]   This test claim was processed through [facility]- copay amounts may vary at other pharmacies due to pharmacy/plan contracts, or as the patient moves through the different stages of their insurance plan.  Roland Earl, CPHT Pharmacy Patient Advocate Specialist [facility] Patient Advocate Team Direct Number: [phone]  Fax: [phone]

## 2022-09-11 NOTE — Progress Notes (Signed)
[facility]  Date of Admission:  09/09/2022      Abx: Vanc zyvox  ASSESSMENT: Facial cellulitis/abscess, likely staph aureus  No culture  PLAN: Given continued significant improvement, will stop vanc today Continue zyvox and finish 12 more days on 5/06 Id clinic f/u with me on 5/14 @ 245 pm -- patient's family can call and make video visit if so wishes Will sign off Discussed with primary team     Principal Problem:   Facial cellulitis   Allergies  Allergen Reactions   Ketamine Other (See Comments)    Drops respiration down to a dangerous level.    Actifed Cold-Allergy [Chlorpheniramine-Phenylephrine] Other (See Comments)    "Allergic," per facility   Amoxicillin Other (See Comments)    "Allergic," per facility   Keflex [Cephalexin] Other (See Comments)    "Allergic," per facility   Penicillins Other (See Comments)    "Allergic," per facility    Scheduled Meds:  acetaminophen  650 mg Oral TID   enoxaparin (LOVENOX) injection  40 mg Subcutaneous Q24H   linezolid  600 mg Oral Q12H   mupirocin ointment   Nasal BID   QUEtiapine  12.5 mg Oral QHS   Continuous Infusions:  vancomycin Stopped (09/10/22 1811)   PRN Meds:.   SUBJECTIVE: No complaint Improved pain/swelling in face  Review of Systems: ROS All other ROS was negative, except mentioned above     OBJECTIVE: Vitals:   09/10/22 0508 09/10/22 0509 09/10/22 2106 09/11/22 0627  BP: 107/73 107/73 (!) 118/98 97/76  Pulse: 80 84 99 76  Resp: Temp:  (!) 97.5 F (36.4 C) 97.6 F (36.4 C) 97.7 F (36.5 C)  TempSrc:  Oral Axillary Axillary  SpO2: 97% 97%    Weight:      Height:       Body mass index is 23.41 kg/m.  Physical Exam General/constitutional: no distress, pleasant, nonverbal, but interactive; down syndrome facial feature HEENT: Normocephalic, PER, Conj Clear, EOMI, Oropharynx clear Neck supple CV: rrr no mrg Lungs: clear to  auscultation, normal respiratory effort Abd: Soft, Nontender Ext: no edema Skin: improved swelling upper lips; eschar remains; no purulence    Lab Results Lab Results  Component Value Date   WBC 5.8 09/10/2022   HGB 12.9 (L) 09/10/2022   HCT 39.2 09/10/2022   MCV 99.5 09/10/2022   PLT 244 09/10/2022    Lab Results  Component Value Date   CREATININE 0.69 09/10/2022   BUN 8 09/10/2022   NA 137 09/10/2022   K 3.7 09/10/2022   CL 102 09/10/2022   CO2 26 09/10/2022    Lab Results  Component Value Date   ALT 12 09/10/2022   AST 19 09/10/2022   ALKPHOS 58 09/10/2022   BILITOT 0.7 09/10/2022      Microbiology: Recent Results (from the past 240 hour(s))  MRSA Next Gen by PCR, Nasal     Status: None   Collection Time: 09/09/22  4:19 PM   Specimen: Nasal Mucosa; Nasal Swab  Result Value Ref Range Status   MRSA by PCR Next Gen NOT DETECTED NOT DETECTED Final    Comment: (NOTE) The GeneXpert MRSA Assay (FDA approved for NASAL specimens only), is one component of a comprehensive MRSA colonization surveillance program. It is not intended to diagnose MRSA infection nor to guide or monitor treatment for MRSA infections. Test performance is not FDA approved in patients less than 2  years old. Performed at [facility], [address], [place], [place] [address]      Serology:   Imaging: If present, new imagings (plain films, ct scans, and mri) have been personally visualized and interpreted; radiology reports have been reviewed. Decision making incorporated into the Impression / Recommendations.   Raymondo Band, MD [facility] [phone] pager    09/11/2022, 10:08 AM

## 2022-09-11 NOTE — TOC Initial Note (Signed)
Transition of Care [facility]) - Initial/Assessment Note    Patient Details  Name: [name] MRN: [id number] Date of Birth: Dec 12, 1960  [facility]) CM/SW Contact:    Otelia Santee, LCSW Phone Number: 09/11/2022, 12:39 PM  Clinical Narrative:                 Pt currently resides at [facility] group home. Pt's 3 sisters are his legal guardians and are very involved in pt's care.  Spoke with Greggory Brandy who shares she is currently out of the state however, her other two sisters are present and have been coming to the hospital. Plan is for pt to return to group home at discharge.  Spoke with [facility] who share pt is able to return at discharge. DC summary will need to be faxed for review to [phone].   Expected Discharge Plan: Group Home Barriers to Discharge: Continued Medical Work up   Patient Goals and CMS Choice Patient states their goals for this hospitalization and ongoing recovery are:: For pt to return to [facility] group home CMS Medicare.gov Compare Post Acute Care list provided to:: Legal Guardian Choice offered to / list presented to : [facility] POA / Guardian Thor ownership interest in [facility].provided to:: [facility] POA / Guardian    Expected Discharge Plan and Services In-house Referral: NA Discharge Planning Services: NA Post Acute Care Choice: NA Living arrangements for the past 2 months: Group Home                 DME Arranged: N/A DME Agency: NA                  Prior Living Arrangements/Services Living arrangements for the past 2 months: Group Home Lives with:: Facility Resident Patient language and need for interpreter reviewed:: Yes Do you feel safe going back to the place where you live?: Yes      Need for Family Participation in Patient Care: Yes (Comment) Care giver support system in place?: Yes (comment)   Criminal Activity/Legal Involvement Pertinent to Current Situation/Hospitalization: No - Comment as needed  Activities of  Daily Living Home Assistive Devices/Equipment: None ADL Screening (condition at time of admission) Patient's cognitive ability adequate to safely complete daily activities?: No Is the patient deaf or have difficulty hearing?: No Does the patient have difficulty seeing, even when wearing glasses/contacts?: No Does the patient have difficulty concentrating, remembering, or making decisions?: Yes Patient able to express need for assistance with ADLs?: No Does the patient have difficulty dressing or bathing?: Yes Independently performs ADLs?: No Communication: Needs assistance Is this a change from baseline?: Pre-admission baseline Dressing (OT): Needs assistance Does the patient have difficulty walking or climbing stairs?: No Weakness of Legs: Both Weakness of Arms/Hands: None  Permission Sought/Granted Permission sought to share information with : Facility Medical sales representative, Family Supports, Guardian Permission granted to share information with : Yes, Verbal Permission Granted  Share Information with NAME: All 3 legal guardians  Permission granted to share info w AGENCY: [facility]  Permission granted to share info w Relationship: Sisters/legal guardians     Emotional Assessment Appearance:: Appears stated age Attitude/Demeanor/Rapport: Unable to Assess Affect (typically observed): Unable to Assess Orientation: : Oriented to Self Alcohol / Substance Use: Not Applicable Psych Involvement: No (comment)  Admission diagnosis:  Facial cellulitis [L03.211] Patient Active Problem List   Diagnosis Date Noted   Facial cellulitis 09/09/2022   Bilateral impacted cerumen 02/19/2019   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 [place]. [address] [place] [place] [address] Phone: [phone] Fax: [phone]     Social Determinants of Health (SDOH) Social History: SDOH Screenings   Tobacco Use: Unknown (09/09/2022)   SDOH Interventions:     Readmission Risk Interventions    09/11/2022   12:37 PM  Readmission Risk Prevention Plan  Post Dischage Appt Complete  Medication Screening Complete  Transportation Screening Complete

## 2022-09-11 NOTE — Progress Notes (Signed)
PROGRESS NOTE    [name]  ZOX:[id number] DOB: August 30, 1960 DOA: 09/09/2022 PCP: Oneita Hurt, No   Brief Narrative: [name] is a 62 y.o. male with medical history significant of Down syndrome, autism low functioning admitted with facial swelling and redness.  History obtained from the patient's sister.  Patient came to the ER few days ago from the group home he was given clindamycin.  Since swelling and the redness has not been getting better they brought him back to the hospital.  It started as a pustule in his left nose with increased swelling and erythema around the upper lip and left side of his nose.  He was given vancomycin lorazepam and diazepam in the ER.   ED Course: He was given vancomycin. Blood pressure 103/70 pulse is 100 temperature 98 -99% on room air Normal white count normal renal function Maxillofacial CT- 1.8 x 2.2 x 2.0 cm soft tissue abscess in the infranasal pre maxillary soft tissues slightly to the left of midline. Low-density regions are also seen along the columella, the anterior nasal septum, and along the nasal bridge, which may represent additional regions of abscess or phlegmonous change.  4/24: ENT and ID both are on board.  No need for any surgical intervention and clinically seems improving on vancomycin and linezolid.  Per ID will continue with current regimen today and switch him to p.o. linezolid from tomorrow for another 12 days.  Patient can be discharged tomorrow to his group home.  Assessment & Plan:   Principal Problem:   Facial cellulitis   #1 left facial cellulitis with abscess-patient admitted with erythema swelling to the left upper lips left side of his nose started as a pustule.     CT -1.8 x 2.2 x 2.0 cm soft tissue abscess in the infranasal pre maxillary soft tissues slightly to the left of midline. Low-density regions are also seen along the columella, the anterior nasal septum, and along the nasal bridge, which may represent  additional regions of abscess or phlegmonous change. ENT and ID is on board-no need for any surgical intervention On vancomycin/zyvox-continue today and then switch to p.o. Zyvox for another 12 days  #2 Down syndrome/autism/mental retardation lives in a group home. Continue Seroquel at night for sleep.   #3 goals of care discussed with patient's sister he is-DNR.   Estimated body mass index is 23.41 kg/m as calculated from the following:   Height as of this encounter:  (1.473 m).   Weight as of this encounter: 50.8 kg.  DVT prophylaxis: lovenox Code Status:dnr Family Communication: Discussed with 2 sisters at bedside Disposition Plan:  Status is: Inpatient    Consultants: ENT.  ID  Procedures:none Antimicrobials:  Anti-infectives (From admission, onward)    Start     Dose/Rate Route Frequency Ordered Stop   09/10/22 2200  linezolid (ZYVOX) tablet 600 mg        600 mg Oral Every 12 hours 09/10/22 1017 09/24/22 2159   09/10/22 1800  vancomycin (VANCOCIN) IVPB 1000 mg/200 mL premix        1,000 mg 200 mL/hr over 60 Minutes Intravenous Every 24 hours 09/10/22 0846 09/11/22 2359   09/10/22 1200  vancomycin (VANCOCIN) IVPB 1000 mg/200 mL premix  Status:  Discontinued        1,000 mg 200 mL/hr over 60 Minutes Intravenous Every 24 hours 09/09/22 1306 09/09/22 1742   09/09/22 2200  linezolid (ZYVOX) tablet 600 mg  Status:  Discontinued  600 mg Oral Every 12 hours 09/09/22 1713 09/10/22 0845   09/09/22 1130  vancomycin (VANCOCIN) IVPB 1000 mg/200 mL premix        1,000 mg 200 mL/hr over 60 Minutes Intravenous  Once 09/09/22 1115 09/09/22 1340        Subjective: Patient was eating breakfast with the help of sister when seen today.  Sister was concerned that he was not drinking much water.  Patient is nonverbal and not follow commands at baseline.  Objective: Vitals:   09/10/22 0508 09/10/22 0509 09/10/22 2106 09/11/22 0627  BP: 107/73 107/73 (!) 118/98 97/76   Pulse: 80 84 99 76  Resp: Temp:  (!) 97.5 F (36.4 C) 97.6 F (36.4 C) 97.7 F (36.5 C)  TempSrc:  Oral Axillary Axillary  SpO2: 97% 97%    Weight:      Height:        Intake/Output Summary (Last 24 hours) at 09/11/2022 1411 Last data filed at 09/11/2022 1300 Gross per 24 hour  Intake 791.43 ml  Output --  Net 791.43 ml    Filed Weights   09/09/22 1050  Weight: 50.8 kg    Examination:  General.  Cognitively impaired gentleman, in no acute distress. Mild erythema and edema involving upper lip and left side of Nose Pulmonary.  Lungs clear bilaterally, normal respiratory effort. CV.  Regular rate and rhythm, no JVD, rub or murmur. Abdomen.  Soft, nontender, nondistended, BS positive. CNS.  Alert, nonverbal and not follow commands, at baseline Extremities.  No edema, no cyanosis, pulses intact and symmetrical. Psychiatry.  Judgment and insight appears impaired   Data Reviewed: I have personally reviewed following labs and imaging studies  CBC: Recent Labs  Lab 09/07/22 1146 09/09/22 1144 09/10/22 0429  WBC 8.3 9.8 5.8  NEUTROABS 5.5 6.9  --   HGB 14.0 13.8 12.9*  HCT 42.3 41.1 39.2  MCV 99.3 98.6 99.5  PLT 226 230 244    Basic Metabolic Panel: Recent Labs  Lab 09/07/22 1146 09/09/22 1144 09/10/22 0429  NA 139 137 137  K 4.0 3.8 3.7  CL 103 101 102  CO2 GLUCOSE 105* 104* 100*  BUN CREATININE 0.74 0.70 0.69  CALCIUM 9.0 9.1 8.2*    GFR: Estimated Creatinine Clearance: 63.8 mL/min (by C-G formula based on SCr of 0.69 mg/dL). Liver Function Tests: Recent Labs  Lab 09/07/22 1146 09/09/22 1144 09/10/22 0429  AST ALT ALKPHOS 60 61 58  BILITOT 0.6 0.5 0.7  PROT 6.8 6.8 6.3*  ALBUMIN 3.6 3.4* 2.7*    No results for input(s): "LIPASE", "AMYLASE" in the last 168 hours. No results for input(s): "AMMONIA" in the last 168 hours. Coagulation Profile: No results for input(s): "INR", "PROTIME" in  the last 168 hours. Cardiac Enzymes: No results for input(s): "CKTOTAL", "CKMB", "CKMBINDEX", "TROPONINI" in the last 168 hours. BNP (last 3 results) No results for input(s): "PROBNP" in the last 8760 hours. HbA1C: No results for input(s): "HGBA1C" in the last 72 hours. CBG: No results for input(s): "GLUCAP" in the last 168 hours. Lipid Profile: No results for input(s): "CHOL", "HDL", "LDLCALC", "TRIG", "CHOLHDL", "LDLDIRECT" in the last 72 hours. Thyroid Function Tests: No results for input(s): "TSH", "T4TOTAL", "FREET4", "T3FREE", "THYROIDAB" in the last 72 hours. Anemia Panel: No results for input(s): "VITAMINB12", "FOLATE", "FERRITIN", "TIBC", "IRON", "RETICCTPCT" in the last 72 hours. Sepsis Labs: Recent Labs  Lab 09/09/22 1144  LATICACIDVEN 1.9     Recent Results (from the past 240 hour(s))  MRSA Next Gen by PCR, Nasal     Status: None   Collection Time: 09/09/22  4:19 PM   Specimen: Nasal Mucosa; Nasal Swab  Result Value Ref Range Status   MRSA by PCR Next Gen NOT DETECTED NOT DETECTED Final    Comment: (NOTE) The GeneXpert MRSA Assay (FDA approved for NASAL specimens only), is one component of a comprehensive MRSA colonization surveillance program. It is not intended to diagnose MRSA infection nor to guide or monitor treatment for MRSA infections. Test performance is not FDA approved in patients less than 47 years old. Performed at [facility], [address] [place], [place] [address]          Radiology Studies: No results found.      Scheduled Meds:  acetaminophen  650 mg Oral TID   enoxaparin (LOVENOX) injection  40 mg Subcutaneous Q24H   linezolid  600 mg Oral Q12H   QUEtiapine  12.5 mg Oral QHS   Continuous Infusions:  vancomycin Stopped (09/10/22 1811)     LOS: 2 days   Time spent: 40 min  This record has been created using Conservation officer, historic buildings. Errors have been sought and corrected,but may not always be  located. Such creation errors do not reflect on the standard of care.   Arnetha Courser, MD 09/11/2022, 2:11 PM

## 2022-09-12 DIAGNOSIS — L03211 Cellulitis of face: Secondary | ICD-10-CM | POA: Diagnosis not present

## 2022-09-12 MED ORDER — LINEZOLID 600 MG PO TABS: 600.0000 mg | ORAL_TABLET | Freq: Two times a day (BID) | ORAL | 0 refills | Status: AC

## 2022-09-12 NOTE — Progress Notes (Signed)
Patient criss crossed on the bathroom floor. Not wanting to get up off the floor. Sister is at bedside.  Family is requesting to help patient to the toilet. Patient is non compliant and extending arms out on the bathroom floor.

## 2022-09-12 NOTE — Progress Notes (Signed)
Notified Security for extra assistance to help discharge patient.  Patient unable to understand that he is being discharge.  Need assistance to place him in wheelchair. Patient is resistant to any assistance. Sister at bedside.

## 2022-09-12 NOTE — Progress Notes (Signed)
Patient on the floor criss cross in a corner. Patient is not wanting to sit in chair or bed even with sister Erskine Squibb assistance.  Safety sitter at bedside.

## 2022-09-12 NOTE — TOC Transition Note (Addendum)
Transition of Care [facility]) - CM/SW Discharge Note   Patient Details  Name: [name] MRN: [id number] Date of Birth: December 31, 1960  [facility] CM/SW Contact:  Coralyn Helling, LCSW Phone Number: 09/12/2022, 11:48 AM   Clinical Narrative:   TOC faxed dc summary to facility. Nurse, Greig Castilla confirmed receipt of summary. Staff will pick up patient from lobby for transport. Floor RN notified via chat.   11:52 AM Sister Erskine Squibb is willing to transport. TOC confirmed with facility. Greig Castilla reports sister can transport and asked that patient be taken to the day facility. Floor RN notified by chat. Wille Celeste, sister notified by phone.       Final next level of care: Group Home Barriers to Discharge: No Barriers Identified   Patient Goals and CMS Choice CMS Medicare.gov Compare Post Acute Care list provided to:: Legal Guardian Choice offered to / list presented to : [facility] POA / Guardian  Discharge Placement                Patient chooses bed at:  ([facility] group home) Patient to be transferred to facility by: Facility ([facility] group home) Name of family member notified: Greig Castilla, nurse at facility Patient and family notified of of transfer: 09/12/22  Discharge Plan and Services Additional resources added to the After Visit Summary for   In-house Referral: NA Discharge Planning Services: NA Post Acute Care Choice: NA          DME Arranged: N/A DME Agency: NA       HH Arranged: NA HH Agency: NA        Social Determinants of Health (SDOH) Interventions SDOH Screenings   Tobacco Use: Unknown (09/09/2022)     Readmission Risk Interventions    09/11/2022   12:37 PM  Readmission Risk Prevention Plan  Post Dischage Appt Complete  Medication Screening Complete  Transportation Screening Complete

## 2022-09-12 NOTE — Progress Notes (Signed)
Patient is nonverbal but grunts and groans and is sitting criss crossed in the hallway. Cala Bradford (sister) trying to help/assist to persuade the patient up.  Patient refusing

## 2022-09-12 NOTE — Progress Notes (Signed)
Notified Arnetha Courser of patient refusing to get up off the floor and grunting louder.  Per sister, patient wants to leave.  Requesting discharge.

## 2022-09-12 NOTE — Plan of Care (Signed)

## 2022-09-12 NOTE — Discharge Summary (Signed)
Physician Discharge Summary   Patient: [name] MRN: [id number] DOB: 1960-09-05  Admit date:     09/09/2022  Discharge date: 09/12/22  Discharge Physician: Arnetha Courser   PCP: Pcp, No   Recommendations at discharge:  Follow-up with infectious disease on 5/14 Please ensure that he completes the course of antibiotics, need to continue taking until 09/23/2022 Follow-up with primary care provider Follow-up with ENT as needed  Discharge Diagnoses: Principal Problem:   Facial cellulitis   Hospital Course: [name] is a 62 y.o. male with medical history significant of Down syndrome, autism low functioning admitted with facial swelling and redness.  History obtained from the patient's sister.  Patient came to the ER few days ago from the group home he was given clindamycin.  Since swelling and the redness has not been getting better they brought him back to the hospital.  It started as a pustule in his left nose with increased swelling and erythema around the upper lip and left side of his nose.  He was given vancomycin lorazepam and diazepam in the ER.   ED Course: He was given vancomycin. Blood pressure 103/70 pulse is 100 temperature 98 -99% on room air Normal white count normal renal function Maxillofacial CT- 1.8 x 2.2 x 2.0 cm soft tissue abscess in the infranasal pre maxillary soft tissues slightly to the left of midline. Low-density regions are also seen along the columella, the anterior nasal septum, and along the nasal bridge, which may represent additional regions of abscess or phlegmonous change.   4/24: ENT and ID both are on board.  No need for any surgical intervention and clinically seems improving on vancomycin and linezolid.  Per ID will continue with current regimen today and switch him to p.o. linezolid from tomorrow for another 12 days.  4/25: Patient remained stable.  Infectious disease suggested taking Zyvox for another 12 days, or until 09/23/2022.   They will follow-up as outpatient on 5/14.  Patient will continue taking his home medications and follow-up with his providers for further recommendations.   Consultants: ENT.  Infectious disease Procedures performed: None Disposition: Group home Diet recommendation:  Discharge Diet Orders (From admission, onward)     Start     Ordered   09/12/22 0000  Diet - low sodium heart healthy        09/12/22 1119           Regular diet DISCHARGE MEDICATION: Allergies as of 09/12/2022       Reactions   Ketamine Other (See Comments)   Drops respiration down to a dangerous level.    Actifed Cold-allergy [chlorpheniramine-phenylephrine] Other (See Comments)   "Allergic," per facility   Amoxicillin Other (See Comments)   "Allergic," per facility   Keflex [cephalexin] Other (See Comments)   "Allergic," per facility   Penicillins Other (See Comments)   "Allergic," per facility        Medication List     STOP taking these medications    clindamycin 300 MG capsule Commonly known as: CLEOCIN   HYDROcodone-acetaminophen 5-325 MG tablet Commonly known as: NORCO/VICODIN       TAKE these medications    linezolid 600 MG tablet Commonly known as: ZYVOX Take 1 tablet (600 mg total) by mouth every 12 (twelve) hours for 24 doses.   liver oil-zinc oxide 40 % ointment Commonly known as: DESITIN Apply 1 application  topically 3 (three) times daily. In between buttocks and scrotum areas   methenamine 1 g tablet Commonly known as:  HIPREX Take 1 g by mouth in the morning and at bedtime.   mineral oil liquid Take 1 mL by mouth See admin instructions. Apply 1 ml to the affected area(s) of the right ear 2 times a  day   MINERIN EX Apply 1 application  topically See admin instructions. Apply to both arms, hands, and legs 2 times a day   CERAVE MOISTURIZING EX Apply 1 application  topically See admin instructions. Apply to body after every morning shower   multivitamin tablet Take  1 tablet by mouth daily with breakfast.   Ocuvite Adult 50+ Caps Take 1 capsule by mouth in the morning.   oyster calcium 500 MG Tabs tablet Take 500 mg of elemental calcium by mouth daily at 8 pm.   Vitamin D3 1000 units Caps Take 2,000 Units by mouth in the morning.   white petrolatum ointment Apply 1 application  topically See admin instructions. Apply to the lips in the morning and at bedtime        Follow-up Information     Raymondo Band, MD. Go on 10/01/2022.   Specialty: Infectious Diseases Why: At 2:45 PM Contact information: [address] [place] [address] [place] [address] [phone]                Discharge Exam: Ceasar Mons Weights   09/09/22 1050  Weight: 50.8 kg   General.  Cognitively impaired gentleman, in no acute distress. Pulmonary.  Lungs clear bilaterally, normal respiratory effort. CV.  Regular rate and rhythm, no JVD, rub or murmur. Abdomen.  Soft, nontender, nondistended, BS positive. CNS.  Alert and oriented .  No focal neurologic deficit. Extremities.  No edema, no cyanosis, pulses intact and symmetrical. Psychiatry.  Judgment and insight appears impaired.   Condition at discharge: stable  The results of significant diagnostics from this hospitalization (including imaging, microbiology, ancillary and laboratory) are listed below for reference.   Imaging Studies: CT Maxillofacial W Contrast  Result Date: 09/09/2022 CLINICAL DATA:  Nasal abscess infection. EXAM: CT MAXILLOFACIAL WITH CONTRAST TECHNIQUE: Multidetector CT imaging of the maxillofacial structures was performed with intravenous contrast. Multiplanar CT image reconstructions were also generated. RADIATION DOSE REDUCTION: This exam was performed according to the departmental dose-optimization program which includes automated exposure control, adjustment of the mA and/or kV according to patient size and/or use of iterative reconstruction technique. CONTRAST:  75mL OMNIPAQUE IOHEXOL 300  MG/ML  SOLN COMPARISON:  None Available. FINDINGS: Osseous: No fracture or mandibular dislocation. No destructive process. Orbits: Negative. No traumatic or inflammatory finding.1 Sinuses: Trace mucosal thickening in the bilateral maxillary sinuses. Paranasal sinuses are otherwise clear. Soft tissues: There is a 1.8 x 2.2 x 2.0 cm soft tissue abscess in the infranasal pre maxillary soft tissues slightly to the left of midline (series 2, image 57). Low-density regions are also seen along the columella, the anterior nasal septum (series 2, image 43), and along the nasal bridge, which may represent additional regions of abscess or phlegmonous change. Limited intracranial: No significant or unexpected finding. IMPRESSION: 1.8 x 2.2 x 2.0 cm soft tissue abscess in the infranasal pre maxillary soft tissues slightly to the left of midline. Low-density regions are also seen along the columella, the anterior nasal septum, and along the nasal bridge, which may represent additional regions of abscess or phlegmonous change. Electronically Signed   By: Lorenza Cambridge M.D.   On: 09/09/2022 12:50    Microbiology: Results for orders placed or performed during the hospital encounter of 09/09/22  MRSA Next Gen by PCR, Nasal     Status: None   Collection Time: 09/09/22  4:19 PM   Specimen: Nasal Mucosa; Nasal Swab  Result Value Ref Range Status   MRSA by PCR Next Gen NOT DETECTED NOT DETECTED Final    Comment: (NOTE) The GeneXpert MRSA Assay (FDA approved for NASAL specimens only), is one component of a comprehensive MRSA colonization surveillance program. It is not intended to diagnose MRSA infection nor to guide or monitor treatment for MRSA infections. Test performance is not FDA approved in patients less than 14 years old. Performed at [facility], [address] [place], [place] [address]     Labs: CBC: Recent Labs  Lab 09/07/22 1146 09/09/22 1144 09/10/22 0429  WBC 8.3 9.8 5.8   NEUTROABS 5.5 6.9  --   HGB 14.0 13.8 12.9*  HCT 42.3 41.1 39.2  MCV 99.3 98.6 99.5  PLT 226 230 244   Basic Metabolic Panel: Recent Labs  Lab 09/07/22 1146 09/09/22 1144 09/10/22 0429  NA 139 137 137  K 4.0 3.8 3.7  CL 103 101 102  CO2 GLUCOSE 105* 104* 100*  BUN CREATININE 0.74 0.70 0.69  CALCIUM 9.0 9.1 8.2*   Liver Function Tests: Recent Labs  Lab 09/07/22 1146 09/09/22 1144 09/10/22 0429  AST ALT ALKPHOS 60 61 58  BILITOT 0.6 0.5 0.7  PROT 6.8 6.8 6.3*  ALBUMIN 3.6 3.4* 2.7*   CBG: No results for input(s): "GLUCAP" in the last 168 hours.  Discharge time spent: greater than 30 minutes.  This record has been created using Conservation officer, historic buildings. Errors have been sought and corrected,but may not always be located. Such creation errors do not reflect on the standard of care.   Signed: Arnetha Courser, MD [facility] 09/12/2022

## 2022-09-12 NOTE — Progress Notes (Signed)
Patient is on the floor with arms extending out pushing staff away from him.  Patient pushed chair and chair hit the sisters foot. Patient continues to sit criss crossed on the floor.

## 2022-10-01 ENCOUNTER — Other Ambulatory Visit: Payer: Self-pay

## 2022-10-01 ENCOUNTER — Ambulatory Visit (INDEPENDENT_AMBULATORY_CARE_PROVIDER_SITE_OTHER): Payer: Medicare Other | Admitting: Internal Medicine

## 2022-10-01 VITALS — Temp 97.7°F

## 2022-10-01 DIAGNOSIS — K122 Cellulitis and abscess of mouth: Secondary | ICD-10-CM

## 2022-10-01 NOTE — Progress Notes (Signed)
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   MRN: [id number]  Chief Complaint  Patient presents with   Follow-up    HPI:  [name] is a 62 y.o. male here for f/u hospital admission facial cellitis  Treated with 2 weeks zyvox. Initial 2 days vanc  Resolved  Mother and group home provider here --. Right blow red raw rash present. Said left lip lesion present as well    Allergies  Allergen Reactions   Ketamine Other (See Comments)    Drops respiration down to a dangerous level.    Actifed Cold-Allergy [Chlorpheniramine-Phenylephrine] Other (See Comments)    "Allergic," per facility   Amoxicillin Other (See Comments)    "Allergic," per facility   Keflex [Cephalexin] Other (See Comments)    "Allergic," per facility   Penicillins Other (See Comments)    "Allergic," per facility      Outpatient Medications Prior to Visit  Medication Sig Dispense Refill   Cholecalciferol (VITAMIN D3) 1000 units CAPS Take 2,000 Units by mouth in the morning.     Emollient (CERAVE MOISTURIZING EX) Apply 1 application  topically See admin instructions. Apply to body after every morning shower     Emollient (MINERIN EX) Apply 1 application  topically See admin instructions. Apply to both arms, hands, and legs 2 times a day     liver oil-zinc oxide (DESITIN) 40 % ointment Apply 1 application  topically 3 (three) times daily. In between buttocks and scrotum areas     methenamine (HIPREX) 1 g tablet Take 1 g by mouth in the morning and at bedtime.      mineral oil liquid Take 1 mL by mouth See admin instructions. Apply 1 ml to the affected area(s) of the right ear 2 times a  day     montelukast (SINGULAIR) 10 MG tablet Take by mouth.     Multiple Vitamin (MULTIVITAMIN) tablet Take 1 tablet by mouth daily with breakfast.     Multiple Vitamins-Minerals (OCUVITE ADULT 50+) CAPS Take 1 capsule by mouth in the morning.     Oyster Shell (OYSTER CALCIUM) 500 MG TABS tablet Take 500 mg of elemental calcium by mouth daily at 8 pm.     white petrolatum ointment Apply 1 application  topically See admin instructions. Apply to the lips in the morning and at bedtime     No facility-administered medications prior to visit.     Social History   Socioeconomic History   Marital status: Married    Spouse name: Not on file   Number of children: Not on file   Years of education: Not on file   Highest education level: Not on file  Occupational History   Not on file  Tobacco Use   Smoking status: Never   Smokeless tobacco: Not on file  Substance and Sexual Activity   Alcohol use: No   Drug use: Not on file   Sexual activity: Not on file  Other Topics Concern   Not on file  Social History Narrative   Not  on file   Social Determinants of Health   Financial Resource Strain: Not on file  Food Insecurity: Not on file  Transportation Needs: Not on file  Physical Activity: Not on file  Stress: Not on file  Social Connections: Not on file  Intimate Partner Violence: Not on file      Review of Systems    All other ros negative -- patient with cp/mental disorder unable to give Objective:    Temp 97.7 F (36.5 C) (Temporal)  Nursing note and vital signs reviewed.  Physical Exam     General/constitutional: no distress, nonverbal; not cooperative with exam HEENT: Normocephalic; upper left buccal mucosal lip no lesion Neck supple CV: rrr no mrg Lungs: clear to auscultation, normal respiratory effort Abd: Soft, Nontender Ext: no  edema Skin: 2 linear raw erythema right elbow crease no satellite lesion or cellulitic change Neuro: nonfocal MSK: no peripheral joint swelling/tenderness/warmth; back spines nontender   Labs:  Micro:  Serology:  Imaging:  Assessment & Plan:   Problem List Items Addressed This Visit   None Visit Diagnoses     Cellulitis of mouth    -  Primary         No orders of the defined types were placed in this encounter.    Facial cellulitis, likely staph aureus resolved with zyvox Patient's mother asked about right elbow linear erythematous raw wound --> either trauma induced vs less likely fungal vs moisture dermatitis. Appear to be healing --> prn vaseline and if itchy more raw would try antifungal cream  F/u as needed if recurrent cellulitis (sign discussed)   Follow-up: Return if symptoms worsen or fail to improve.      Raymondo Band, MD [facility] 10/01/2022, 3:03 PM

## 2022-10-01 NOTE — Patient Instructions (Signed)
Cellulitis resolved  F/u as needed if s/s of another bout

## 2022-11-19 ENCOUNTER — Emergency Department (HOSPITAL_COMMUNITY)
Admission: EM | Admit: 2022-11-19 | Discharge: 2022-11-19 | Disposition: A | Discharge status: Home / Self Care | Payer: Medicare Other | Attending: Emergency Medicine | Admitting: Emergency Medicine

## 2022-11-19 ENCOUNTER — Emergency Department (HOSPITAL_COMMUNITY): Payer: Medicare Other

## 2022-11-19 ENCOUNTER — Other Ambulatory Visit: Payer: Self-pay

## 2022-11-19 DIAGNOSIS — R451 Restlessness and agitation: Secondary | ICD-10-CM | POA: Diagnosis not present

## 2022-11-19 DIAGNOSIS — R112 Nausea with vomiting, unspecified: Secondary | ICD-10-CM | POA: Diagnosis present

## 2022-11-19 DIAGNOSIS — R1110 Vomiting, unspecified: Secondary | ICD-10-CM

## 2022-11-19 DIAGNOSIS — R41841 Cognitive communication deficit: Secondary | ICD-10-CM | POA: Insufficient documentation

## 2022-11-19 MED ORDER — HALOPERIDOL LACTATE 5 MG/ML IJ SOLN
2.0000 mg | Freq: Once | INTRAMUSCULAR | Status: DC
  Filled 2022-11-19: qty 1

## 2022-11-19 MED ORDER — MIDAZOLAM HCL 2 MG/2ML IJ SOLN
1.0000 mg | Freq: Once | INTRAMUSCULAR | Status: AC
  Administered 2022-11-19: 1 mg via INTRAVENOUS
  Filled 2022-11-19: qty 2

## 2022-11-19 NOTE — Discharge Instructions (Signed)
Unable to obtain a full workup due to agitation and the patient.  In shared decision-making with his legal guardian we have opted to discontinue the workup rather than provide more calming medications.  He should return immediately for further workup if he has increased cough, fever, significant lethargy or becomes difficult to arouse.

## 2022-11-19 NOTE — ED Provider Notes (Signed)
[place] EMERGENCY DEPARTMENT AT [facility] Provider Note   CSN: [id number] Arrival date & time: 11/19/22  1407     History  Chief Complaint  Patient presents with   Nausea   Emesis    [name] is a 62 y.o. 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  History is gathered at bedside with the patient's sister, review of EMR.  She reports that he vomited mucoid vomitus twice today.  He does have a history of dysphagia and requires his food to be minced.  She thinks that he may have vomited due to his swallowing issues.  She states he has been in his otherwise normal state of health, no recent ear infections.  She asked that we   "do as little as possible" in the workup.  She also states that he would do better with something to calm him down.  The presiding physician at [facility] sent him in for to rule out aspiration.   Emesis      Home Medications Prior to Admission medications   Medication Sig Start Date End Date Taking? Authorizing Provider  Cholecalciferol (VITAMIN D3) 1000 units CAPS Take 2,000 Units by mouth in the morning.    [provider]  Emollient (CERAVE MOISTURIZING EX) Apply 1 application  topically See admin instructions. Apply to body after every morning shower    [provider]  Emollient [facility] EX) Apply 1 application  topically See admin instructions. Apply to both arms, hands, and legs 2 times a day    [provider]  liver oil-zinc oxide (DESITIN) 40 % ointment Apply 1 application  topically 3 (three) times daily. In between buttocks and scrotum areas    [provider]  methenamine (HIPREX) 1 g tablet Take 1 g by mouth in the morning and at bedtime.    [provider]  mineral oil liquid Take 1 mL by mouth See admin instructions. Apply 1 ml to the affected area(s) of the right ear 2 times a  day    [provider]  montelukast (SINGULAIR) 10 MG tablet Take by mouth.    [provider]  Multiple Vitamin (MULTIVITAMIN) tablet Take 1 tablet by mouth daily with breakfast.    [provider]  Multiple Vitamins-Minerals (OCUVITE ADULT 50+) CAPS Take 1 capsule by mouth in the morning.    [provider]  Ethelda Chick (OYSTER CALCIUM) 500 MG TABS tablet Take 500 mg of elemental calcium by mouth daily at 8 pm.    [provider]  white petrolatum ointment Apply 1 application  topically See admin instructions. Apply to the lips in the morning and at bedtime    [provider]      Allergies    Ketamine, Actifed cold-allergy [chlorpheniramine-phenylephrine], Amoxicillin, Keflex [cephalexin], and Penicillins    Review of Systems   Review of Systems  Gastrointestinal:  Positive for vomiting.    Physical Exam Updated Vital Signs BP (!) 140/86   Resp (!) 23  Physical Exam Vitals and nursing note reviewed.  Constitutional:      General: He is not in acute distress.    Appearance: He is well-developed. He is not diaphoretic.  HENT:     Head: Normocephalic and atraumatic.  Eyes:     General: No scleral icterus.    Conjunctiva/sclera: Conjunctivae normal.  Cardiovascular:     Rate and Rhythm: Normal rate  and regular rhythm.     Heart sounds: Normal heart sounds.  Pulmonary:     Effort: Pulmonary effort is normal. No respiratory distress.     Breath sounds: Normal breath sounds.  Abdominal:     Palpations: Abdomen is soft.     Tenderness: There is no abdominal tenderness.  Musculoskeletal:     Cervical back: Normal range of motion and neck supple.  Skin:    General: Skin is warm and dry.  Neurological:     Mental Status: He is alert.  Psychiatric:        Speech: He is noncommunicative.        Behavior: Behavior is agitated.     ED Results / Procedures / Treatments   Labs (all labs ordered are listed, but only abnormal results are  displayed) Labs Reviewed - No data to display  EKG None  Radiology No results found.  Procedures Procedures    Medications Ordered in ED Medications - No data to display  ED Course/ Medical Decision Making/ A&P                             Medical Decision Making Amount and/or Complexity of Data Reviewed Labs: ordered.  Risk Prescription drug management.   \62 year old male with history of profound intellectual disability, Down's, dysphagia.  He was given Ativan without significant improvement in his agitation. I ordered Haldol however patient's legal guardian at bedside declines this medication and states that she does not think he needs any further workup at this time.  I discussed our options including plan for use of calming medications to achieve full workup and rule out any other abnormal causes of vomiting and or aspiration pneumonia.  Currently patient is not hypoxic his EKG shows no evidence of acute ischemic change.  Shared decision making with patient's legal guardian at bedside will discharge back to the facility with close observation and strict return precautions.  She is comfortable with this plan.  Appears otherwise appropriate for discharge        Final Clinical Impression(s) / ED Diagnoses Final diagnoses:  None    Rx / DC Orders ED Discharge Orders     None         Arthor Captain, PA-C 11/19/22 1701    Gerhard Munch, MD 11/26/22 [phone]

## 2022-11-19 NOTE — ED Triage Notes (Signed)
Pt BIB EMS from [facility]. Pt was noted to have nausea and vomiting today. Vomit one time last night. Pt does have history of downs.   VS 152/96  94  CBG 110  95% on room air

## 2023-01-15 ENCOUNTER — Telehealth: Payer: Self-pay

## 2023-01-15 ENCOUNTER — Other Ambulatory Visit: Payer: Self-pay

## 2023-01-15 ENCOUNTER — Other Ambulatory Visit (HOSPITAL_COMMUNITY): Payer: Self-pay

## 2023-01-15 MED ORDER — LINEZOLID 600 MG PO TABS
600.0000 mg | ORAL_TABLET | Freq: Two times a day (BID) | ORAL | 0 refills | Status: DC
  Filled 2023-01-15: qty 14, 7d supply, fill #0

## 2023-01-15 NOTE — Telephone Encounter (Signed)
Patient's mother called stating the group home called stating the patient is having redness and swelling around his nose. She reports no fevers. She stated she was afraid it was cellulitis again. No availability here this week with a providers. His mother wants to avoid taking him to the hospital at this time.  Per Judeth Cornfield ok to send in 7 days of Linezolid 600 mg BID. I have sent the Rx to [facility] and informed the patient's mother. He will follow up with Dr. Renold Don on 9/4//24. Makynna Manocchio T Pricilla Loveless

## 2023-01-16 ENCOUNTER — Other Ambulatory Visit (HOSPITAL_COMMUNITY): Payer: Self-pay

## 2023-01-22 ENCOUNTER — Encounter: Payer: Self-pay | Admitting: Internal Medicine

## 2023-01-22 ENCOUNTER — Other Ambulatory Visit: Payer: Self-pay

## 2023-01-22 ENCOUNTER — Ambulatory Visit (INDEPENDENT_AMBULATORY_CARE_PROVIDER_SITE_OTHER): Payer: Medicare Other | Admitting: Internal Medicine

## 2023-01-22 VITALS — BP 111/75

## 2023-01-22 DIAGNOSIS — L0390 Cellulitis, unspecified: Secondary | ICD-10-CM

## 2023-01-22 MED ORDER — DOXYCYCLINE HYCLATE 100 MG PO TABS: 100.0000 mg | ORAL_TABLET | Freq: Two times a day (BID) | ORAL | 0 refills | Status: AC

## 2023-01-22 NOTE — Progress Notes (Signed)
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   MRN: [id number]  Chief Complaint  Patient presents with   Follow-up    Facial Cellulitis     HPI:  [name] is a 62 y.o. male here for f/u hospital admission facial cellitis  Treated with 2 weeks zyvox. Initial 2 days vanc  Resolved  Mother and group home provider here --. Right blow red raw rash present. Said left lip lesion present as well   ----------- 01/22/23 id clinic f/u Patient was given linezolid a week which he just finished the last 24 hours No f/c Nose swelling improved  Mother thinks the nose is still somewhat swollen  Patient reported not to often pick nose but runs hand over the nasal bridge/nostril quite often  Allergies  Allergen Reactions   Ketamine Other (See Comments)    Drops respiration down to a dangerous level.    Actifed Cold-Allergy [Chlorpheniramine-Phenylephrine] Other (See Comments)    "Allergic," per facility   Amoxicillin Other (See Comments)    "Allergic," per facility   Keflex [Cephalexin] Other (See Comments)    "Allergic," per facility   Penicillins Other (See Comments)    "Allergic," per facility      Outpatient Medications Prior to Visit  Medication Sig Dispense Refill   Cholecalciferol (VITAMIN D3) 1000 units CAPS Take 2,000 Units by mouth in the morning.     Emollient (CERAVE MOISTURIZING EX) Apply 1 application  topically See admin instructions. Apply to body after every morning shower     Emollient (MINERIN EX) Apply 1 application   topically See admin instructions. Apply to both arms, hands, and legs 2 times a day     liver oil-zinc oxide (DESITIN) 40 % ointment Apply 1 application  topically 3 (three) times daily. In between buttocks and scrotum areas     methenamine (HIPREX) 1 g tablet Take 1 g by mouth in the morning and at bedtime.     mineral oil liquid Take 1 mL by mouth See admin instructions. Apply 1 ml to the affected area(s) of the right ear 2 times a  day     montelukast (SINGULAIR) 10 MG tablet Take by mouth.     Multiple Vitamin (MULTIVITAMIN) tablet Take 1 tablet by mouth daily with breakfast.     Multiple Vitamins-Minerals (OCUVITE ADULT 50+) CAPS Take 1 capsule by mouth in the morning.     Oyster Shell (OYSTER CALCIUM) 500 MG TABS tablet Take 500 mg of elemental calcium by mouth daily at 8 pm.     white petrolatum ointment Apply 1 application  topically See admin instructions. Apply to the lips in the morning and at bedtime     linezolid (ZYVOX) 600 MG tablet Take 1 tablet (600 mg total) by mouth 2 (two) times daily for 7 days. (Patient not taking: Reported on 01/22/2023) 14 tablet 0   No facility-administered medications prior to visit.     Social History   Socioeconomic History   Marital status: Married    Spouse name: Not on file  Number of children: Not on file   Years of education: Not on file   Highest education level: Not on file  Occupational History   Not on file  Tobacco Use   Smoking status: Never   Smokeless tobacco: Not on file  Substance and Sexual Activity   Alcohol use: No   Drug use: Not on file   Sexual activity: Not on file  Other Topics Concern   Not on file  Social History Narrative   Not on file   Social Determinants of Health   Financial Resource Strain: Not on file  Food Insecurity: Not on file  Transportation Needs: Not on file  Physical Activity: Not on file  Stress: Not on file  Social Connections: Unknown (05/17/2022)   Received from [facility]   Social  Network    Social Network: Not on file  Intimate Partner Violence: Unknown (05/17/2022)   Received from [facility]   HITS    Physically Hurt: Not on file    Insult or Talk Down To: Not on file    Threaten Physical Harm: Not on file    Scream or Curse: Not on file      Review of Systems    All other ros negative -- patient with cp/mental disorder unable to give Objective:    BP 111/75  Nursing note and vital signs reviewed.  Physical Exam     General/constitutional: no distress, nonverbal; not cooperative with exam HEENT: Normocephalic; upper left buccal mucosal lip no lesion Neck supple CV: rrr no mrg Lungs: clear to auscultation, normal respiratory effort Abd: Soft, Nontender Ext: no edema Skin: nose looks normal to me; the left nares seems to have a "pimple" with crusting/eschar around it    MSK: no peripheral joint swelling/tenderness/warmth; back spines nontender     Labs:  Micro:  Serology:  Imaging:  Assessment & Plan:   Problem List Items Addressed This Visit   None Visit Diagnoses     Cellulitis, unspecified cellulitis site    -  Primary          No orders of the defined types were placed in this encounter.    Facial cellulitis, likely staph aureus resolved with zyvox Patient's mother asked about right elbow linear erythematous raw wound --> either trauma induced vs less likely fungal vs moisture dermatitis. Appear to be healing --> prn vaseline and if itchy more raw would try antifungal cream  F/u as needed if recurrent cellulitis (sign discussed)   -------------- 9/4 id assessment Difficicult to say if cellulitis still present. Swelling from previous picture to today had improved. Mother touched and moved nasal bridge without sign of tenderness, but I agree there is slight erythema over the nasal bridge still. No known rosacea   Ok to give 7 more days doxycycline. If after this the nose looks the same, then I do not believe  there is any more infection and no further abx needed   F/u in 2-3 weeks    Follow-up: Return in about 3 weeks (around 02/12/2023).      Raymondo Band, MD [facility] 01/22/2023, 3:06 PM

## 2023-01-22 NOTE — Addendum Note (Signed)
Addended by: Rutha Bouchard T on: 01/22/2023 03:26 PM   Modules accepted: Orders

## 2023-01-22 NOTE — Patient Instructions (Signed)
Ok to give another week doxycycline 100 mg twice a day   See me in about 3 weeks for follow up or sooner if any concern (spreading redness, swelling, pain)

## 2023-02-18 ENCOUNTER — Encounter: Payer: Self-pay | Admitting: Internal Medicine

## 2023-02-18 ENCOUNTER — Ambulatory Visit (INDEPENDENT_AMBULATORY_CARE_PROVIDER_SITE_OTHER): Payer: Medicare Other | Admitting: Internal Medicine

## 2023-02-18 ENCOUNTER — Other Ambulatory Visit: Payer: Self-pay

## 2023-02-18 DIAGNOSIS — L03211 Cellulitis of face: Secondary | ICD-10-CM | POA: Diagnosis not present

## 2023-02-18 NOTE — Patient Instructions (Signed)
If there is runny nose which can irritate/scab the skin, can apply a thin layer of vaseline to the affected nare twice a day as needed   Please sign up to my chart and so you can send question/picture as needed   No need to follow up with [place] at this time

## 2023-02-18 NOTE — Progress Notes (Signed)
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   MRN: [id number]  Chief Complaint  Patient presents with   Follow-up    HPI:  [name] is a 62 y.o. male here for f/u hospital admission facial cellitis  Treated with 2 weeks zyvox. Initial 2 days vanc  Resolved  Mother and group home provider here --. Right blow red raw rash present. Said left lip lesion present as well   ----------- 01/22/23 id clinic f/u Patient was given linezolid a week which he just finished the last 24 hours No f/c Nose swelling improved  Mother thinks the nose is still somewhat swollen  Patient reported not to often pick nose but runs hand over the nasal bridge/nostril quite often   02/18/23 id clnic f/u See a&p for detail  Allergies  Allergen Reactions   Ketamine Other (See Comments)    Drops respiration down to a dangerous level.    Actifed Cold-Allergy [Chlorpheniramine-Phenylephrine] Other (See Comments)    "Allergic," per facility   Amoxicillin Other (See Comments)    "Allergic," per facility   Keflex [Cephalexin] Other (See Comments)    "Allergic," per facility   Penicillins Other (See Comments)    "Allergic," per facility      Outpatient Medications Prior to Visit  Medication Sig Dispense Refill   Cholecalciferol (VITAMIN D3) 1000 units CAPS Take 2,000 Units by mouth in the morning.     Emollient (CERAVE MOISTURIZING EX) Apply 1 application  topically See admin instructions. Apply to body after every morning shower     Emollient (MINERIN EX)  Apply 1 application  topically See admin instructions. Apply to both arms, hands, and legs 2 times a day     liver oil-zinc oxide (DESITIN) 40 % ointment Apply 1 application  topically 3 (three) times daily. In between buttocks and scrotum areas     methenamine (HIPREX) 1 g tablet Take 1 g by mouth in the morning and at bedtime.     mineral oil liquid Take 1 mL by mouth See admin instructions. Apply 1 ml to the affected area(s) of the right ear 2 times a  day     montelukast (SINGULAIR) 10 MG tablet Take by mouth.     Multiple Vitamin (MULTIVITAMIN) tablet Take 1 tablet by mouth daily with breakfast.     Multiple Vitamins-Minerals (OCUVITE ADULT 50+) CAPS Take 1 capsule by mouth in the morning.     Oyster Shell (OYSTER CALCIUM) 500 MG TABS tablet Take 500 mg of elemental calcium by mouth daily at 8 pm.     white petrolatum ointment Apply 1 application  topically See admin instructions. Apply to the lips in the morning and at bedtime     No facility-administered medications prior to visit.     Social History   Socioeconomic History   Marital status: Married    Spouse name: Not on file   Number of children: Not on file   Years of education: Not on file   Highest education level: Not on file  Occupational History  Not on file  Tobacco Use   Smoking status: Never   Smokeless tobacco: Not on file  Substance and Sexual Activity   Alcohol use: No   Drug use: Not on file   Sexual activity: Not on file  Other Topics Concern   Not on file  Social History Narrative   Not on file   Social Determinants of Health   Financial Resource Strain: Not on file  Food Insecurity: Not on file  Transportation Needs: Not on file  Physical Activity: Not on file  Stress: Not on file  Social Connections: Unknown (05/17/2022)   Received from [facility], [facility]   Social Network    Social Network: Not on file  Intimate Partner Violence: Unknown (05/17/2022)   [facility],  [facility]   HITS    Physically Hurt: Not on file    Insult or Talk Down To: Not on file    Threaten Physical Harm: Not on file    Scream or Curse: Not on file      Review of Systems    All other ros negative -- patient with cp/mental disorder unable to give Objective:    There were no vitals taken for this visit. Nursing note and vital signs reviewed.  Physical Exam     General/constitutional: no distress, nonverbal; not cooperative with exam HEENT: Normocephalic; upper left buccal mucosal lip no lesion Neck supple CV: rrr no mrg Lungs: clear to auscultation, normal respiratory effort Abd: Soft, Nontender Ext: no edema Skin: nose looks normal to me; the left nares seems to have a "pimple" with crusting/eschar around it    MSK: no peripheral joint swelling/tenderness/warmth; back spines nontender   02/18/23 picture Mother said swelling/redness basically gone     Labs:  Micro:  Serology:  Imaging:  Assessment & Plan:   Problem List Items Addressed This Visit   None Visit Diagnoses     Cellulitis of face    -  Primary           No orders of the defined types were placed in this encounter.    Facial cellulitis, likely staph aureus resolved with zyvox Patient's mother asked about right elbow linear erythematous raw wound --> either trauma induced vs less likely fungal vs moisture dermatitis. Appear to be healing --> prn vaseline and if itchy more raw would try antifungal cream  F/u as needed if recurrent cellulitis (sign discussed)   -------------- 9/4 id assessment Difficicult to say if cellulitis still present. Swelling from previous picture to today had improved. Mother touched and moved nasal bridge without sign of tenderness, but I agree there is slight erythema over the nasal bridge still. No known rosacea   Ok to give 7 more days doxycycline. If after this the nose looks the same, then I do not believe there is any more infection and  no further abx needed   F/u in 2-3 weeks    02/18/23 id assessment Doing well cellulitis resolved Per report patient doesn't seem to be picking the affected area any more Advise if dryness which can occur or runny nose which can macerate the area and predispose to infection, then can apply thin layer of vaseline around the nares  F/u as needed    Follow-up: No follow-ups on file.      Raymondo Band, MD [facility] 02/18/2023, 11:08 AM

## 2023-05-12 ENCOUNTER — Encounter (HOSPITAL_BASED_OUTPATIENT_CLINIC_OR_DEPARTMENT_OTHER): Payer: Self-pay | Admitting: Emergency Medicine

## 2023-05-12 ENCOUNTER — Emergency Department (HOSPITAL_BASED_OUTPATIENT_CLINIC_OR_DEPARTMENT_OTHER): Payer: Medicare Other

## 2023-05-12 ENCOUNTER — Emergency Department (HOSPITAL_BASED_OUTPATIENT_CLINIC_OR_DEPARTMENT_OTHER)
Admission: EM | Admit: 2023-05-12 | Discharge: 2023-05-12 | Disposition: A | Discharge status: Home / Self Care | Payer: Medicare Other | Attending: Emergency Medicine | Admitting: Emergency Medicine

## 2023-05-12 ENCOUNTER — Other Ambulatory Visit: Payer: Self-pay

## 2023-05-12 DIAGNOSIS — R1110 Vomiting, unspecified: Secondary | ICD-10-CM | POA: Diagnosis not present

## 2023-05-12 DIAGNOSIS — Z20822 Contact with and (suspected) exposure to covid-19: Secondary | ICD-10-CM | POA: Diagnosis not present

## 2023-05-12 DIAGNOSIS — R059 Cough, unspecified: Secondary | ICD-10-CM | POA: Diagnosis present

## 2023-05-12 LAB — RESP PANEL BY RT-PCR (RSV, FLU A&B, COVID)  RVPGX2
Influenza A by PCR: NEGATIVE
Influenza B by PCR: NEGATIVE
Resp Syncytial Virus by PCR: NEGATIVE
SARS Coronavirus 2 by RT PCR: NEGATIVE

## 2023-05-12 MED ORDER — ONDANSETRON 4 MG PO TBDP: 8.0000 mg | ORAL_TABLET | Freq: Once | ORAL | Status: DC

## 2023-05-12 NOTE — ED Provider Notes (Signed)
[place] EMERGENCY DEPARTMENT AT [facility] Provider Note   CSN: [id number] Arrival date & time: 05/12/23  4098     History  Chief Complaint  Patient presents with   Emesis   HPI [name] is a 62 y.o. male with history of Down syndrome presenting for cough. His sister is his caretaker states that since this past Friday he has been coughing up mucus like yellow sputum. She is unsure if it is coming from his stomach or maybe his lungs. Reports he is also had a cough as well.  Denies fever.  Unsure if he has abdominal pain. Caregiver also mention that he was started on Zyvox for MRSA infection in his nose this past Friday.  Symptoms started shortly after.  She believes that this could be contributing to his symptoms today.    Emesis      Home Medications Prior to Admission medications   Medication Sig Start Date End Date Taking? Authorizing Provider  Cholecalciferol (VITAMIN D3) 1000 units CAPS Take 2,000 Units by mouth in the morning.    [provider]  Emollient (CERAVE MOISTURIZING EX) Apply 1 application  topically See admin instructions. Apply to body after every morning shower    [provider]  Emollient [facility] EX) Apply 1 application  topically See admin instructions. Apply to both arms, hands, and legs 2 times a day    [provider]  liver oil-zinc oxide (DESITIN) 40 % ointment Apply 1 application  topically 3 (three) times daily. In between buttocks and scrotum areas    [provider]  methenamine (HIPREX) 1 g tablet Take 1 g by mouth in the morning and at bedtime.    [provider]  mineral oil liquid Take 1 mL by mouth See admin instructions. Apply 1 ml to the affected area(s) of the right ear 2 times a  day    [provider]  montelukast (SINGULAIR) 10 MG tablet Take by mouth.    [provider]  Multiple Vitamin (MULTIVITAMIN) tablet Take 1 tablet by mouth daily with breakfast.     [provider]  Multiple Vitamins-Minerals (OCUVITE ADULT 50+) CAPS Take 1 capsule by mouth in the morning.    [provider]  Ethelda Chick (OYSTER CALCIUM) 500 MG TABS tablet Take 500 mg of elemental calcium by mouth daily at 8 pm.    [provider]  white petrolatum ointment Apply 1 application  topically See admin instructions. Apply to the lips in the morning and at bedtime    [provider]      Allergies    Ketamine, Actifed cold-allergy [chlorpheniramine-phenylephrine], Amoxicillin, Keflex [cephalexin], and Penicillins    Review of Systems   Review of Systems  Gastrointestinal:  Positive for vomiting.    Physical Exam Updated Vital Signs BP (!) 111/94 (BP Location: Right Arm)   Pulse (!) 53   Temp 97.8 F (36.6 C)   Resp 19   Wt 49 kg   SpO2 95%   BMI 22.57 kg/m  Physical Exam Vitals and nursing note reviewed.  HENT:     Head: Normocephalic and atraumatic.     Mouth/Throat:     Mouth: Mucous membranes are moist.  Eyes:     General:        Right eye: No discharge.        Left eye: No discharge.     Conjunctiva/sclera: Conjunctivae normal.  Cardiovascular:     Rate and Rhythm: Normal rate and  regular rhythm.     Pulses: Normal pulses.     Heart sounds: Normal heart sounds.  Pulmonary:     Effort: Pulmonary effort is normal.     Breath sounds: Normal breath sounds. No decreased breath sounds, wheezing, rhonchi or rales.  Abdominal:     General: Abdomen is flat. There is no distension.     Palpations: Abdomen is soft.     Tenderness: There is no abdominal tenderness.  Skin:    General: Skin is warm and dry.  Neurological:     General: No focal deficit present.  Psychiatric:        Mood and Affect: Mood normal.     ED Results / Procedures / Treatments   Labs (all labs ordered are listed, but only abnormal results are displayed) Labs Reviewed  RESP PANEL BY RT-PCR (RSV, FLU A&B, COVID)  [id number]     EKG None  Radiology DG Chest Port 1 View Result Date: 05/12/2023 CLINICAL DATA:  Cough. EXAM: PORTABLE CHEST 1 VIEW COMPARISON:  08/08/2006. FINDINGS: Low lung volume. Bilateral lung apices are covered by patient's jaw. Bilateral lung fields are otherwise clear. Bilateral costophrenic angles are clear. Normal cardio-mediastinal silhouette. No acute osseous abnormalities. The soft tissues are within normal limits. IMPRESSION: No active disease. Electronically Signed   By: Jules Schick M.D.   On: 05/12/2023 11:29    Procedures Procedures    Medications Ordered in ED Medications  ondansetron (ZOFRAN-ODT) disintegrating tablet 8 mg (has no administration in time range)    ED Course/ Medical Decision Making/ A&P                                 Medical Decision Making Amount and/or Complexity of Data Reviewed Radiology: ordered.  Risk Prescription drug management.   62 year old well-appearing male presenting for cough and concern for emesis.  Exam was unremarkable I did witness him cough and appeared to cough up yellowish-white sputum.  His sister reports that this is what they have been seeing over the weekend.  Overall patient looks well, no acute distress and hemodynamically stable.  Concern for possible URI.  Respiratory swab was negative. I personally reviewed interpreted chest x-ray which did not reveal any acute cardiopulmonary process.  Considered intra-abdominal process but unlikely given benign abdominal exam along with reassuring vitals.  Suspect that the output is sputum and not from the GI tract.  Symptoms could be an ongoing respiratory viral illness of some kind.  Advised supportive treatment at home.  Advised follow-up PCP.  Discussed pertinent return precautions.  Vitals stable.  Discharged in good condition.        Final Clinical Impression(s) / ED Diagnoses Final diagnoses:  Cough, unspecified type    Rx / DC Orders ED Discharge Orders     None          Gareth Eagle, PA-C 05/12/23 1335    Gwyneth Sprout, MD 05/12/23 1419

## 2023-05-12 NOTE — ED Notes (Signed)
Unable to obtain pulse ox in triage. Will attempt again when in room. No respiratory distress noted

## 2023-05-12 NOTE — Discharge Instructions (Addendum)
Evaluation today was overall reassuring.  Suspect her symptoms could be related to an ongoing respiratory viral illness.  Treatment at home is supportive which includes rest and hydration.  Also can treat with Tylenol ibuprofen.  Please have him follow-up PCP.  If he does develop nausea vomiting diarrhea or abdominal pain, fever, altered mental status or any other concerning symptom please return emergency department further evaluation.

## 2023-05-12 NOTE — ED Triage Notes (Signed)
Pt bib sister and caregiver, reports n/v with decreased po intake starting yesterday. Denies fever

## 2023-05-27 ENCOUNTER — Other Ambulatory Visit: Payer: Self-pay

## 2023-05-27 ENCOUNTER — Encounter: Payer: Self-pay | Admitting: Internal Medicine

## 2023-05-27 ENCOUNTER — Ambulatory Visit (INDEPENDENT_AMBULATORY_CARE_PROVIDER_SITE_OTHER): Payer: Medicare Other | Admitting: Internal Medicine

## 2023-05-27 VITALS — BP 181/102 | Wt 107.0 lb

## 2023-05-27 DIAGNOSIS — L0390 Cellulitis, unspecified: Secondary | ICD-10-CM | POA: Diagnosis not present

## 2023-05-27 NOTE — Patient Instructions (Signed)
 Instructions on group home's sheet

## 2023-05-27 NOTE — Progress Notes (Signed)
 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   MRN: [id number]  Chief Complaint  Patient presents with   Follow-up    HPI:  [name] is a 63 y.o. male here for f/u hospital admission facial cellitis  Treated with 2 weeks zyvox . Initial 2 days vanc  Resolved  Mother and group home provider here --. Right blow red raw rash present. Said left lip lesion present as well   ----------- 01/22/23 id clinic f/u Patient was given linezolid  a week which he just finished the last 24 hours No f/c Nose swelling improved  Mother thinks the nose is still somewhat swollen  Patient reported not to often pick nose but runs hand over the nasal bridge/nostril quite often   05/27/23 id clinic f/u See a&p for detail  Allergies  Allergen Reactions   Ketamine Other (See Comments)    Drops respiration down to a dangerous level.    Actifed Cold-Allergy [Chlorpheniramine-Phenylephrine ] Other (See Comments)    Allergic, per facility   Amoxicillin Other (See Comments)    Allergic, per facility   Keflex [Cephalexin] Other (See Comments)    Allergic, per facility   Penicillins Other (See Comments)    Allergic, per facility      Outpatient Medications Prior to Visit  Medication Sig Dispense Refill   Cholecalciferol (VITAMIN D3) 1000 units CAPS Take 2,000 Units by mouth in the morning.     Emollient (CERAVE MOISTURIZING EX) Apply 1 application  topically See admin instructions. Apply to body after every morning shower     Emollient (MINERIN EX)  Apply 1 application  topically See admin instructions. Apply to both arms, hands, and legs 2 times a day     liver oil-zinc oxide (DESITIN) 40 % ointment Apply 1 application  topically 3 (three) times daily. In between buttocks and scrotum areas     methenamine (HIPREX) 1 g tablet Take 1 g by mouth in the morning and at bedtime.     mineral oil liquid Take 1 mL by mouth See admin instructions. Apply 1 ml to the affected area(s) of the right ear 2 times a  day     montelukast (SINGULAIR) 10 MG tablet Take by mouth.     Multiple Vitamin (MULTIVITAMIN) tablet Take 1 tablet by mouth daily with breakfast.     Multiple Vitamins-Minerals (OCUVITE ADULT 50+) CAPS Take 1 capsule by mouth in the morning.     Oyster Shell (OYSTER CALCIUM) 500 MG TABS tablet Take 500 mg of elemental calcium by mouth daily at 8 pm.     white petrolatum ointment Apply 1 application  topically See admin instructions. Apply to the lips in the morning and at bedtime     No facility-administered medications prior to visit.     Social History   Socioeconomic History   Marital status: Single    Spouse name: Not on file   Number of children: Not on file   Years of education: Not on file   Highest education level: Not on file  Occupational History  Not on file  Tobacco Use   Smoking status: Never   Smokeless tobacco: Not on file  Substance and Sexual Activity   Alcohol use: No   Drug use: Not on file   Sexual activity: Not on file  Other Topics Concern   Not on file  Social History Narrative   Not on file   Social Drivers of Health   Financial Resource Strain: Not on file  Food Insecurity: Not on file  Transportation Needs: Not on file  Physical Activity: Not on file  Stress: Not on file  Social Connections: Unknown (05/17/2022)   Received from [facility]   Social Network    Social Network: Not on file  Intimate Partner Violence: Unknown (05/17/2022)   [facility]   HITS    Physically Hurt: Not on file    Insult or Talk Down To: Not on file    Threaten Physical Harm: Not on file    Scream or Curse: Not on file      Review of Systems    All other ros negative -- patient with cp/mental disorder unable to give Objective:    BP (!) 181/102   Wt 107 lb (48.5 kg)   BMI 22.36 kg/m  Nursing note and vital signs reviewed.  Physical Exam     General/constitutional: no distress nonverbal; restless HEENT: Normocephalic, PER, Conj Clear CV: rrr no mrg Lungs: normal respiratory effort Abd: Soft, Nontender Ext: no edema Skin: No Rash Neuro: nonfocal - cerebral palsy spectrum MSK: no peripheral joint swelling/tenderness/warmth; back spines nontender      Labs:  Micro:  Serology:  Imaging:  Assessment & Plan:   Problem List Items Addressed This Visit   None Visit Diagnoses       Recurrent cellulitis    -  Primary            No orders of the defined types were placed in this encounter.    Facial cellulitis, likely staph aureus resolved with zyvox  Patient's mother asked about right elbow linear erythematous raw wound --> either trauma induced vs less likely fungal vs moisture dermatitis. Appear to be healing --> prn vaseline and if itchy more raw would try antifungal cream  F/u as needed if recurrent cellulitis (sign discussed)   -------------- 9/4 id assessment Difficicult to say if cellulitis still present. Swelling from previous picture to today had improved. Mother touched and moved nasal bridge without sign of tenderness, but I agree there is slight erythema over the nasal bridge still. No known rosacea   Ok to give 7 more days doxycycline . If after this the nose looks the same, then I do not believe there is any more infection and no further abx needed   F/u in 2-3 weeks    02/18/23 id assessment Doing well cellulitis resolved Per report patient doesn't seem to be picking the affected area any  more Advise if dryness which can occur or runny nose which can macerate the area and predispose to infection, then can apply thin layer of vaseline around the nares  F/u as needed   05/27/23 id assessment Patient's facial cellulitis recurrent and had treated by 02/18/23 Family members and group home member are here and said there was a recent outbreak on upper lip and had taken abx with resolution Family wondering what to do long term We discussed decolonization today as I presume this is staph aureus process -- they do not want to do  this at this point. Discussed outpatient decolonization doesn't always correlate with positive result in terms of elimiminating/reducing risk of recurent staph aureus infection Continue prn abx treatment when it occurs Also apply vaseline/moisturizer to external nares/lips bid to keep it from being cracked and increased risk for infection  F/u as needed     Follow-up: Return if symptoms worsen or fail to improve.      Constance ONEIDA Passer, MD [facility] 05/27/2023, 4:08 PM

## 2023-07-28 ENCOUNTER — Ambulatory Visit: Payer: Self-pay | Admitting: Dentistry

## 2023-07-28 DIAGNOSIS — Q909 Down syndrome, unspecified: Secondary | ICD-10-CM

## 2023-07-30 ENCOUNTER — Other Ambulatory Visit: Payer: Self-pay

## 2023-07-30 ENCOUNTER — Encounter (HOSPITAL_COMMUNITY): Payer: Self-pay | Admitting: *Deleted

## 2023-07-30 NOTE — Progress Notes (Addendum)
 PCP - Dr. Katina Dung Cardiologist - denies  PPM/ICD - denies   Chest x-ray - 05/12/23 EKG - 11/19/22 Stress Test - denies ECHO - denies Cardiac Cath - denies  CPAP - denies  DM- denies  ASA/Blood Thinner Instructions: n/a   ERAS Protcol - clears until 0800  COVID TEST- n/a  Anesthesia review: yes  Patient's RN Tabitha verbally denies pt has any shortness of breath, fever, cough and chest pain during phone call  Spoke with Wille Celeste, pt's sister. She had concerns about the pt receiving Ketamine, and wants him to receive versed with anesthesia. I advised her that we have ketamine listed as an allergy so that will not be given. She will be here tomorrow, along with pt's other sister and she can speak to the anesthesiologist about this.    Questions were answered for pt's nurse. Tabitha, RN,  verbalized understanding of instructions.

## 2023-07-30 NOTE — Progress Notes (Signed)
 Anesthesia Chart Review: [name]  Case: [id number] Date/Time: 07/31/23 1045   Procedure: DENTAL RESTORATION/EXTRACTION WITH X-RAY   Anesthesia type: General   Pre-op diagnosis: DENTAL CARIES   [facility] ROOM 09 / [facility]   Surgeons: [name], DMD       DISCUSSION: Patient is a 63 year old male scheduled for the above procedure. Case was moved from [facility] to [facility] Main OR, reportedly due to concerns potential for uncooperativeness and less resources. H&P completed by Dr. Katina Dung on 07/29/23.  History includes never smoker, Down syndrome, intellectual disability, dysphagia (08/10/06 EGD showed esophageal ulcer), GERD, left infranasal abscess with facial cellulitis (08/2022, likely Staph aureus, resolved with Zyvox per 05/27/23 ID follow-up with Rutha Bouchard, MD).   He resides in a group home ([facility]), but he has 3 sisters who are actively involved in his care. Two of his sisters will be with him on the day of procedure.   There is also a clinical FYI of Blood Products Refusal.  Per communication received from Dr. Domenic Polite office, family does not want ketamine used due to previous reaction (listed as "Drops respiration down to a dangerous level"). They would prefer Versed. Anesthesia plan to be further discussed with his anesthesia team on the day of surgery. I updated anesthesiologist Anice Paganini, DO.     VS: Ht 4\' 10"  (1.473 m)   Wt 47.2 kg   BMI 21.74 kg/m   HR 63, BP 120/77(?) per H&P   PROVIDERS: [name], [name] is PCP Rochel Brome, MD is GI S[facility]ty Cords)   LABS: For day of surgery as needed. As of April 2024 glucose was 100, BUN 8, Creatinine 0.69, AST 19, ALT 12, WBC 5.8, H/H 12.9/39.2, PLT 244.    IMAGES: 1V PCXR 05/12/23: FINDINGS: - Low lung volume. Bilateral lung apices are covered by patient's jaw. Bilateral lung fields are otherwise clear. Bilateral costophrenic angles are clear. - Normal cardio-mediastinal silhouette. - No acute osseous  abnormalities. - The soft tissues are within normal limits. IMPRESSION: No active disease.  Xray C-spine 11/11/08: IMPRESSION:  1. Reduced sensitivity as the patient actively resisted flexion or  extension of the neck, and was unable to cooperate with imaging.  On the two views obtained, the predental space is 2.5 mm, which is  at the upper limits of normal range.  2.  Suspected partial fusion between C2 and C3.  3.  Minimal anterior subluxations at C5-6 and C6-7.   EKG: 11/19/22:  Sinus tachycardia Left atrial enlargement Anteroseptal infarct, age indeterminate ST-t wave abnormality Abnormal ECG Confirmed by Gerhard Munch [phone]) on 11/19/2022 2:52:15 PM - Poor r wave progression. Baseline line wanderer in V6.   CV: N/A  Past Medical History:  Diagnosis Date   Down syndrome    Dysphagia 08/10/2006   Esophageal ulcer 08/10/2006   Facial cellulitis 08/2022   related to left infranasal abscess    Past Surgical History:  Procedure Laterality Date   NO PAST SURGERIES      MEDICATIONS: No current facility-administered medications for this encounter.    Cholecalciferol (VITAMIN D3) 1000 units CAPS   Emollient (CERAVE MOISTURIZING EX)   Emollient (MINERIN EX)   liver oil-zinc oxide (DESITIN) 40 % ointment   methenamine (HIPREX) 1 g tablet   mineral oil liquid   montelukast (SINGULAIR) 10 MG tablet   Multiple Vitamin (MULTIVITAMIN) tablet   Multiple Vitamins-Minerals (OCUVITE ADULT 50+) CAPS   Oyster Shell (OYSTER CALCIUM) 500 MG  TABS tablet   white petrolatum ointment    Shonna Chock, PA-C Surgical Short Stay/Anesthesiology [facility] Phone [phone] [facility] Phone [phone] 07/30/2023 2:41 PM

## 2023-07-30 NOTE — Anesthesia Preprocedure Evaluation (Signed)
 Anesthesia Evaluation  Patient identified by MRN, date of birth, ID band Patient awake    Reviewed: Allergy & Precautions, H&P , NPO status , Patient's Chart, lab work & pertinent test results  Airway Mallampati: II  TM Distance: >3 FB Neck ROM: Full    Dental no notable dental hx.    Pulmonary neg pulmonary ROS   Pulmonary exam normal breath sounds clear to auscultation       Cardiovascular negative cardio ROS Normal cardiovascular exam Rhythm:Regular Rate:Normal     Neuro/Psych  negative psych ROS   GI/Hepatic Neg liver ROS, PUD,GERD  ,,  Endo/Other  negative endocrine ROS    Renal/GU negative Renal ROS  negative genitourinary   Musculoskeletal negative musculoskeletal ROS (+)    Abdominal   Peds negative pediatric ROS (+)  Hematology negative hematology ROS (+)   Anesthesia Other Findings Down Syndrome  Reproductive/Obstetrics negative OB ROS                             Anesthesia Physical Anesthesia Plan  ASA: 2  Anesthesia Plan: General   Post-op Pain Management:    Induction: Inhalational  PONV Risk Score and Plan: 2 and Ondansetron and Dexamethasone  Airway Management Planned: Nasal ETT  Additional Equipment:   Intra-op Plan:   Post-operative Plan: Extubation in OR  Informed Consent: I have reviewed the patients History and Physical, chart, labs and discussed the procedure including the risks, benefits and alternatives for the proposed anesthesia with the patient or authorized representative who has indicated his/her understanding and acceptance.     Dental advisory given  Plan Discussed with: CRNA  Anesthesia Plan Comments: (PAT note written 07/30/2023 by Shonna Chock, PA-C. Family had requested no ketamine due to previously reaction.    Patient is a 63 year old male scheduled for the above procedure. [facility] to [facility] Main OR,  reportedly due to concerns potential for uncooperativeness and less resources. H&P completed by Dr. Katina Dung on 07/29/23.   History includes never smoker, Down syndrome, intellectual disability, dysphagia (08/10/06 EGD showed esophageal ulcer), GERD, left infranasal abscess with facial cellulitis (08/2022, likely Staph aureus, resolved with Zyvox per 05/27/23 ID follow-up with Rutha Bouchard, MD).    He resides in a group home ([facility]), but he has 3 sisters who are actively involved in his care. Two of his sisters will be with him on the day of procedure.    There is also a clinical FYI of Blood Products Refusal )       Anesthesia Quick Evaluation

## 2023-07-30 NOTE — Pre-Procedure Instructions (Signed)
-------------    SDW INSTRUCTIONS given:  Your procedure is scheduled on 3/13.  Report to [facility] Main Entrance "A" at 08:30 A.M., and check in at the Admitting office.  Any questions or running late day of surgery: call [phone]    Remember:  Do not eat after midnight the night before your surgery  You may drink clear liquids until 08:00 AM the morning of your surgery.   Clear liquids allowed are: Water, Non-Citrus Juices (without pulp), Carbonated Beverages, Clear Tea, Black Coffee Only, and Gatorade    Take these medicines the morning of surgery with A SIP OF WATER  methenamine    As of today, STOP taking any Aspirin (unless otherwise instructed by your surgeon) Aleve, Naproxen, Ibuprofen, Motrin, Advil, Goody's, BC's, all herbal medications, fish oil, and all vitamins.   Do NOT Smoke (Tobacco/Vaping) 24 hours prior to your procedure  If you use a CPAP at night, you may bring all equipment for your overnight stay.     You will be asked to remove any contacts, glasses, piercing's, hearing aid's, dentures/partials prior to surgery. Please bring cases for these items if needed.     Patients discharged the day of surgery will not be allowed to drive home, and someone needs to stay with them for 24 hours.  SURGICAL WAITING ROOM VISITATION Patients may have no more than 2 support people in the waiting area - these visitors may rotate.   Pre-op nurse will coordinate an appropriate time for 1 ADULT support person, who may not rotate, to accompany patient in pre-op.  Children under the age of 55 must have an adult with them who is not the patient and must remain in the main waiting area with an adult.  If the patient needs to stay at the hospital during part of their recovery, the visitor guidelines for inpatient rooms apply.  Please refer to the [facility] website for the visitor guidelines for any additional information.   Special instructions:   [place]- Preparing For  Surgery   Please follow these instructions carefully.   Shower the NIGHT BEFORE SURGERY and the MORNING OF SURGERY with DIAL Soap.   Pat yourself dry with a CLEAN TOWEL.  Wear CLEAN PAJAMAS to bed the night before surgery  Place CLEAN SHEETS on your bed the night of your first shower and DO NOT SLEEP WITH PETS.   Additional instructions for the day of surgery: DO NOT APPLY any lotions, deodorants, cologne, or perfumes.   Do not wear jewelry or makeup Do not wear nail polish, gel polish, artificial nails, or any other type of covering on natural nails (fingers and toes) Do not bring valuables to the hospital. [facility] is not responsible for valuables/personal belongings. Put on clean/comfortable clothes.  Please brush your teeth.  Ask your nurse before applying any prescription medications to the skin.

## 2023-07-31 ENCOUNTER — Ambulatory Visit (HOSPITAL_COMMUNITY): Payer: Self-pay | Admitting: Vascular Surgery

## 2023-07-31 ENCOUNTER — Ambulatory Visit (HOSPITAL_COMMUNITY)
Admission: RE | Admit: 2023-07-31 | Discharge: 2023-07-31 | Disposition: A | Discharge status: Home / Self Care | Payer: Medicare Other | Attending: Dentistry | Admitting: Dentistry

## 2023-07-31 ENCOUNTER — Encounter (HOSPITAL_COMMUNITY)
Admission: RE | Disposition: A | Discharge status: Home / Self Care | Payer: Self-pay | Source: Home / Self Care | Attending: Dentistry

## 2023-07-31 ENCOUNTER — Encounter (HOSPITAL_COMMUNITY): Payer: Self-pay | Admitting: *Deleted

## 2023-07-31 DIAGNOSIS — F79 Unspecified intellectual disabilities: Secondary | ICD-10-CM | POA: Insufficient documentation

## 2023-07-31 DIAGNOSIS — Q909 Down syndrome, unspecified: Secondary | ICD-10-CM | POA: Diagnosis not present

## 2023-07-31 DIAGNOSIS — K029 Dental caries, unspecified: Secondary | ICD-10-CM | POA: Insufficient documentation

## 2023-07-31 HISTORY — PX: DENTAL RESTORATION/EXTRACTION WITH X-RAY: SHX5796

## 2023-07-31 HISTORY — DX: Gastro-esophageal reflux disease without esophagitis: K21.9

## 2023-07-31 LAB — NO BLOOD PRODUCTS

## 2023-07-31 SURGERY — DENTAL RESTORATION/EXTRACTION WITH X-RAY
Anesthesia: General

## 2023-07-31 MED ORDER — LIDOCAINE-EPINEPHRINE 2 %-1:100000 IJ SOLN
INTRAMUSCULAR | Status: AC
  Filled 2023-07-31: qty 8.5

## 2023-07-31 MED ORDER — KETOROLAC TROMETHAMINE 15 MG/ML IJ SOLN
INTRAMUSCULAR | Status: DC | PRN
  Administered 2023-07-31: 15 mg via INTRAVENOUS

## 2023-07-31 MED ORDER — PROPOFOL 10 MG/ML IV BOLUS
INTRAVENOUS | Status: AC
  Filled 2023-07-31: qty 20

## 2023-07-31 MED ORDER — MIDAZOLAM HCL 2 MG/ML PO SYRP: 0.5000 mg/kg | ORAL_SOLUTION | Freq: Once | ORAL | Status: AC

## 2023-07-31 MED ORDER — ORAL CARE MOUTH RINSE: 15.0000 mL | Freq: Once | OROMUCOSAL | Status: DC

## 2023-07-31 MED ORDER — ONDANSETRON HCL 4 MG/2ML IJ SOLN
INTRAMUSCULAR | Status: DC | PRN
Start: 2023-07-31 — End: 2023-07-31
  Administered 2023-07-31: 4 mg via INTRAVENOUS

## 2023-07-31 MED ORDER — KETOROLAC TROMETHAMINE 30 MG/ML IJ SOLN
INTRAMUSCULAR | Status: AC
  Filled 2023-07-31: qty 1

## 2023-07-31 MED ORDER — HEMOSTATIC AGENTS (NO CHARGE) OPTIME
TOPICAL | Status: DC | PRN
  Administered 2023-07-31: 1 via TOPICAL

## 2023-07-31 MED ORDER — ROCURONIUM BROMIDE 10 MG/ML (PF) SYRINGE
PREFILLED_SYRINGE | INTRAVENOUS | Status: DC | PRN
  Administered 2023-07-31: 5 mg via INTRAVENOUS
  Administered 2023-07-31: 40 mg via INTRAVENOUS

## 2023-07-31 MED ORDER — LIDOCAINE HCL 2 % IJ SOLN
INTRAMUSCULAR | Status: DC | PRN
  Administered 2023-07-31: 3.4 mL

## 2023-07-31 MED ORDER — DEXAMETHASONE SODIUM PHOSPHATE 10 MG/ML IJ SOLN
INTRAMUSCULAR | Status: AC
  Filled 2023-07-31: qty 1

## 2023-07-31 MED ORDER — ONDANSETRON HCL 4 MG/2ML IJ SOLN
INTRAMUSCULAR | Status: AC
  Filled 2023-07-31: qty 2

## 2023-07-31 MED ORDER — SUGAMMADEX SODIUM 200 MG/2ML IV SOLN
INTRAVENOUS | Status: DC | PRN
  Administered 2023-07-31: 100 mg via INTRAVENOUS
  Administered 2023-07-31: 50 mg via INTRAVENOUS

## 2023-07-31 MED ORDER — DROPERIDOL 2.5 MG/ML IJ SOLN: 0.6250 mg | Freq: Once | INTRAMUSCULAR | Status: DC | PRN

## 2023-07-31 MED ORDER — LACTATED RINGERS IV SOLN: INTRAVENOUS | Status: DC | PRN

## 2023-07-31 MED ORDER — OXYCODONE HCL 5 MG PO TABS: 5.0000 mg | ORAL_TABLET | Freq: Once | ORAL | Status: DC | PRN

## 2023-07-31 MED ORDER — CHLORHEXIDINE GLUCONATE 0.12 % MT SOLN: 15.0000 mL | Freq: Once | OROMUCOSAL | Status: DC

## 2023-07-31 MED ORDER — FENTANYL CITRATE (PF) 100 MCG/2ML IJ SOLN: 25.0000 ug | INTRAMUSCULAR | Status: DC | PRN

## 2023-07-31 MED ORDER — MIDAZOLAM HCL 2 MG/ML PO SYRP
ORAL_SOLUTION | ORAL | Status: AC
  Administered 2023-07-31: 24.6 mg via ORAL
  Filled 2023-07-31: qty 15

## 2023-07-31 MED ORDER — DEXAMETHASONE SODIUM PHOSPHATE 10 MG/ML IJ SOLN
INTRAMUSCULAR | Status: DC | PRN
  Administered 2023-07-31: 6 mg via INTRAVENOUS

## 2023-07-31 MED ORDER — EPHEDRINE SULFATE-NACL 50-0.9 MG/10ML-% IV SOSY
PREFILLED_SYRINGE | INTRAVENOUS | Status: DC | PRN
  Administered 2023-07-31: 5 mg via INTRAVENOUS
  Administered 2023-07-31: 10 mg via INTRAVENOUS

## 2023-07-31 MED ORDER — FENTANYL CITRATE (PF) 250 MCG/5ML IJ SOLN
INTRAMUSCULAR | Status: AC
  Filled 2023-07-31: qty 5

## 2023-07-31 MED ORDER — OXYCODONE HCL 5 MG/5ML PO SOLN: 5.0000 mg | Freq: Once | ORAL | Status: DC | PRN

## 2023-07-31 SURGICAL SUPPLY — 25 items
BLADE SURG 15 STRL LF DISP TIS (BLADE) ×1 IMPLANT
CANISTER SUCT 3000ML PPV (MISCELLANEOUS) ×1 IMPLANT
COUNTER NDL 20CT MAGNET RED (NEEDLE) IMPLANT
COVER BACK TABLE 60X90IN (DRAPES) ×1 IMPLANT
COVER MAYO STAND STRL (DRAPES) ×1 IMPLANT
COVER SURGICAL LIGHT HANDLE (MISCELLANEOUS) ×1 IMPLANT
DRAPE HALF SHEET 40X57 (DRAPES) ×1 IMPLANT
GAUZE PACKING FOLDED 2 STR (GAUZE/BANDAGES/DRESSINGS) ×1 IMPLANT
GAUZE SPONGE 4X4 12PLY STRL (GAUZE/BANDAGES/DRESSINGS) IMPLANT
GAUZE SPONGE 4X4 16PLY XRAY LF (GAUZE/BANDAGES/DRESSINGS) ×1 IMPLANT
GLOVE BIO SURGEON STRL SZ 6.5 (GLOVE) ×1 IMPLANT
GOWN STRL REUS W/ TWL LRG LVL3 (GOWN DISPOSABLE) ×2 IMPLANT
KIT BASIN OR (CUSTOM PROCEDURE TRAY) ×1 IMPLANT
KIT TURNOVER KIT B (KITS) ×1 IMPLANT
NDL DENTAL 27 LONG (NEEDLE) ×1 IMPLANT
NEEDLE DENTAL 27 LONG (NEEDLE) ×1 IMPLANT
PAD ARMBOARD 7.5X6 YLW CONV (MISCELLANEOUS) ×2 IMPLANT
SPONGE SURGIFOAM ABS GEL SZ50 (HEMOSTASIS) IMPLANT
SUT CHROMIC 3 0 PS 2 (SUTURE) ×1 IMPLANT
SYR BULB IRRIG 60ML STRL (SYRINGE) ×1 IMPLANT
TOOTHBRUSH ADULT (PERSONAL CARE ITEMS) ×1 IMPLANT
TOWEL GREEN STERILE (TOWEL DISPOSABLE) ×1 IMPLANT
TUBE CONNECTING 12X1/4 (SUCTIONS) ×1 IMPLANT
WATER TABLETS ICX (MISCELLANEOUS) ×1 IMPLANT
YANKAUER SUCT BULB TIP NO VENT (SUCTIONS) ×1 IMPLANT

## 2023-07-31 NOTE — Transfer of Care (Signed)
 Immediate Anesthesia Transfer of Care Note  Patient: [name]  Procedure(s) Performed: DENTAL X-RAY Pasty Spillers WITH X-RAY FULL MOUTH SCALING AND ROOT PLANNING EXTRACTIONS 2,10,15, AND 29  Patient Location: PACU  Anesthesia Type:General  Level of Consciousness: drowsy  Airway & Oxygen Therapy: Patient Spontanous Breathing and Patient connected to nasal cannula oxygen  Post-op Assessment: Report given to RN and Post -op Vital signs reviewed and stable  Post vital signs: Reviewed and stable  Last Vitals:  Vitals Value Taken Time  BP 126/69 07/31/23 1500  Temp 97.0   Pulse 73 07/31/23 1503  Resp 12 07/31/23 1503  SpO2 100 % 07/31/23 1503  Vitals shown include unfiled device data.  Last Pain: There were no vitals filed for this visit.       Complications: No notable events documented.

## 2023-07-31 NOTE — H&P (Signed)
H&P reviewed. Stable for surgery Rori Goar H Baylyn Sickles, DMD  

## 2023-07-31 NOTE — Op Note (Signed)
 [facility]  07/31/2023 Kimberley Dastrup [id number]  Preop DX: Dental caries/behavior management issues due to intellectual disabilities/developmental disabilities. Dental Care provided in OR for medically necessary treatment.  Surgeon: Joanna Hews, DMD  Assistant: Shawnie Pons and hospital staff.  Anesthesia: General  Procedure: The patient was brought into the operating room and placed on the table in a supine position.  General anesthesia was administered via nasal intubation.  The patient was prepped and draped in the usual manner for an intra-oral general dentistry procedure. The oropharynx was suctioned and a moistened oropharyngeal throat pack was placed.    A full intra-oral exam including all hard and soft tissues was performed.  Type of Exam: Recall   Soft Tissue Exam: Floor of the mouth: Normal Buccal mucosa: Normal Soft palate: Normal Hard palate: Normal Tongue: Normal Gingival: Normal Frenum: Normal  Hard tissue exam:  Present: # 2-10, 12-16, 18-19, 29-31 Missing: # 1, 11, 17, 20-28, 32 Un-erupted: # 16 Radiographic findings decay: # none Radiographic findings abscess: # none Mobility class III #29, class II #2, 10, 15  Full mouth series of digital radiographs taken and reviewed. A comprehensive treatment plan was developed.  Perio probing completed and scaling and root planning completed with ultrasonic scaler and hand scalers  Routine extractions were accomplished with simple elevation and use of forceps.   All surgical sites were irrigated with copious amounts of saline. Gel foam was placed in the sockets and hemostasis established with firm pressure. Surgical sites were closed with 3-0 Chromic sutures.  Local Anes:Lidocaine 2% with 1:100,000 epinephrine 3.4 mls The estimated blood loss was 30 mls.   Upon completion of all procedures the oropharynx was irrigated of all debris. Mouth was suctioned dry and a posterior throat pack was carefully  removed with constant suction. Hemostasis was established and a gauze pack was placed as an intraoral pressure dressing. After spontaneous respirations the patient was extubated and transported to the Post-Anesthesia care unit in awake but in a sedated condition. The patient tolerated the procedure well and without complications.  An explanation of procedures and extractions were given to sisters and facility staff.  Operative Procedures:  Scaling and Root planning: Yes - full mouth Extractions completed: # 2, 10, 15, 29 Fluoride varnish: yes   Postoperative Meds:  Ibuprofen 600mg  and tylenol 500mg  every 6 hours if needed for pain  Postoperative Instructions: Extraction sheet signed and given to patient representative.    Joanna Hews, DMD

## 2023-07-31 NOTE — Brief Op Note (Signed)
 07/31/2023  3:07 PM  PATIENT:  [name]  63 y.o. male  PRE-OPERATIVE DIAGNOSIS:  DENTAL CARIES  POST-OPERATIVE DIAGNOSIS:  DENTAL CARIES  PROCEDURE:  Procedure(s): DENTAL X-RAY /EXTRACTION WITH X-RAY FULL MOUTH SCALING AND ROOT PLANNING EXTRACTIONS 2,10,15, AND 29 (N/A)  SURGEON:  Surgeons and Role:    * Raynell Scott, Arley Phenix, DMD - Primary  PHYSICIAN ASSISTANT:   ASSISTANTS: Shawnie Pons and hospital staff   ANESTHESIA:   general  EBL:  30 mL   BLOOD ADMINISTERED:none  DRAINS: none   LOCAL MEDICATIONS USED:  LIDOCAINE  and Amount: 3.4 ml  SPECIMEN:  No Specimen  DISPOSITION OF SPECIMEN:  N/A  COUNTS:  YES  TOURNIQUET:  * No tourniquets in log *  DICTATION: .Note written in EPIC  PLAN OF CARE: Discharge to home after PACU  PATIENT DISPOSITION:  PACU - hemodynamically stable.   Delay start of Pharmacological VTE agent (>24hrs) due to surgical blood loss or risk of bleeding: not applicable

## 2023-08-01 ENCOUNTER — Encounter (HOSPITAL_COMMUNITY): Payer: Self-pay | Admitting: Dentistry

## 2023-08-03 NOTE — Anesthesia Postprocedure Evaluation (Signed)
 Anesthesia Post Note  Patient: [name]  Procedure(s) Performed: DENTAL X-RAY Pasty Spillers WITH X-RAY FULL MOUTH SCALING AND ROOT PLANNING EXTRACTIONS 2,10,15, AND 29     Patient location during evaluation: PACU Anesthesia Type: General Level of consciousness: awake and alert Pain management: pain level controlled Vital Signs Assessment: post-procedure vital signs reviewed and stable Respiratory status: spontaneous breathing, nonlabored ventilation, respiratory function stable and patient connected to nasal cannula oxygen Cardiovascular status: blood pressure returned to baseline and stable Postop Assessment: no apparent nausea or vomiting Anesthetic complications: no   There were no known notable events for this encounter.  Last Vitals:  Vitals:   07/31/23 1615 07/31/23 1630  BP:    Pulse: 87 95  Resp:    Temp:  36.5 C  SpO2: 96% 100%    Last Pain: There were no vitals filed for this visit.               Kennieth Rad

## 2023-12-24 ENCOUNTER — Emergency Department (HOSPITAL_COMMUNITY): Admitting: Certified Registered Nurse Anesthetist

## 2023-12-24 ENCOUNTER — Encounter (HOSPITAL_COMMUNITY): Payer: Self-pay | Admitting: Emergency Medicine

## 2023-12-24 ENCOUNTER — Emergency Department (HOSPITAL_COMMUNITY)
Admission: EM | Admit: 2023-12-24 | Discharge: 2023-12-24 | Disposition: A | Discharge status: Home / Self Care | Source: Skilled Nursing Facility | Attending: Emergency Medicine | Admitting: Emergency Medicine

## 2023-12-24 ENCOUNTER — Other Ambulatory Visit: Payer: Self-pay

## 2023-12-24 ENCOUNTER — Emergency Department (HOSPITAL_COMMUNITY)

## 2023-12-24 ENCOUNTER — Encounter (HOSPITAL_COMMUNITY)
Admission: EM | Disposition: A | Discharge status: Home / Self Care | Payer: Self-pay | Source: Skilled Nursing Facility | Attending: Emergency Medicine

## 2023-12-24 DIAGNOSIS — Z79899 Other long term (current) drug therapy: Secondary | ICD-10-CM | POA: Insufficient documentation

## 2023-12-24 DIAGNOSIS — Q909 Down syndrome, unspecified: Secondary | ICD-10-CM | POA: Insufficient documentation

## 2023-12-24 DIAGNOSIS — K2210 Ulcer of esophagus without bleeding: Secondary | ICD-10-CM | POA: Diagnosis not present

## 2023-12-24 DIAGNOSIS — K209 Esophagitis, unspecified without bleeding: Secondary | ICD-10-CM

## 2023-12-24 DIAGNOSIS — Z66 Do not resuscitate: Secondary | ICD-10-CM | POA: Diagnosis not present

## 2023-12-24 DIAGNOSIS — T18128A Food in esophagus causing other injury, initial encounter: Secondary | ICD-10-CM | POA: Insufficient documentation

## 2023-12-24 DIAGNOSIS — W44F3XA Food entering into or through a natural orifice, initial encounter: Secondary | ICD-10-CM | POA: Diagnosis not present

## 2023-12-24 HISTORY — PX: ESOPHAGOGASTRODUODENOSCOPY: SHX5428

## 2023-12-24 LAB — CBC WITH DIFFERENTIAL/PLATELET
Abs Immature Granulocytes: 0.01 K/uL (ref 0.00–0.07)
Basophils Absolute: 0.1 K/uL (ref 0.0–0.1)
Basophils Relative: 2 %
Eosinophils Absolute: 0 K/uL (ref 0.0–0.5)
Eosinophils Relative: 1 %
HCT: 46.1 % (ref 39.0–52.0)
Hemoglobin: 14.8 g/dL (ref 13.0–17.0)
Immature Granulocytes: 0 %
Lymphocytes Relative: 24 %
Lymphs Abs: 0.9 K/uL (ref 0.7–4.0)
MCH: 31.4 pg (ref 26.0–34.0)
MCHC: 32.1 g/dL (ref 30.0–36.0)
MCV: 97.7 fL (ref 80.0–100.0)
Monocytes Absolute: 0.7 K/uL (ref 0.1–1.0)
Monocytes Relative: 19 %
Neutro Abs: 2.1 K/uL (ref 1.7–7.7)
Neutrophils Relative %: 54 %
Platelets: 254 K/uL (ref 150–400)
RBC: 4.72 MIL/uL (ref 4.22–5.81)
RDW: 15.1 % (ref 11.5–15.5)
WBC: 3.9 K/uL — ABNORMAL LOW (ref 4.0–10.5)
nRBC: 0 % (ref 0.0–0.2)

## 2023-12-24 LAB — URINALYSIS, ROUTINE W REFLEX MICROSCOPIC
Bilirubin Urine: NEGATIVE
Glucose, UA: NEGATIVE mg/dL
Hgb urine dipstick: NEGATIVE
Ketones, ur: 80 mg/dL — AB
Leukocytes,Ua: NEGATIVE
Nitrite: NEGATIVE
Protein, ur: NEGATIVE mg/dL
Specific Gravity, Urine: 1.029 (ref 1.005–1.030)
pH: 5 (ref 5.0–8.0)

## 2023-12-24 LAB — COMPREHENSIVE METABOLIC PANEL WITH GFR
ALT: 16 U/L (ref 0–44)
AST: 32 U/L (ref 15–41)
Albumin: 3.5 g/dL (ref 3.5–5.0)
Alkaline Phosphatase: 61 U/L (ref 38–126)
Anion gap: 14 (ref 5–15)
BUN: 19 mg/dL (ref 6–20)
CO2: 22 mmol/L (ref 22–32)
Calcium: 9.2 mg/dL (ref 8.9–10.3)
Chloride: 105 mmol/L (ref 98–111)
Creatinine, Ser: 0.78 mg/dL (ref 0.61–1.24)
GFR, Estimated: >60 mL/min (ref >60)
Glucose, Bld: 83 mg/dL (ref 70–99)
Potassium: 4.3 mmol/L (ref 3.5–5.1)
Sodium: 141 mmol/L (ref 135–145)
Total Bilirubin: 0.9 mg/dL (ref 0.0–1.2)
Total Protein: 7.6 g/dL (ref 6.5–8.1)

## 2023-12-24 LAB — LIPASE, BLOOD: Lipase: 34 U/L (ref 11–51)

## 2023-12-24 SURGERY — EGD (ESOPHAGOGASTRODUODENOSCOPY)
Anesthesia: General

## 2023-12-24 MED ORDER — LIDOCAINE 2% (20 MG/ML) 5 ML SYRINGE
INTRAMUSCULAR | Status: DC | PRN
  Administered 2023-12-24: 100 mg via INTRAVENOUS

## 2023-12-24 MED ORDER — ONDANSETRON HCL 4 MG/2ML IJ SOLN
INTRAMUSCULAR | Status: DC | PRN
  Administered 2023-12-24: 4 mg via INTRAVENOUS

## 2023-12-24 MED ORDER — EPHEDRINE SULFATE-NACL 50-0.9 MG/10ML-% IV SOSY
PREFILLED_SYRINGE | INTRAVENOUS | Status: DC | PRN
  Administered 2023-12-24: 10 mg via INTRAVENOUS

## 2023-12-24 MED ORDER — PROPOFOL 10 MG/ML IV BOLUS
INTRAVENOUS | Status: AC
  Filled 2023-12-24: qty 20

## 2023-12-24 MED ORDER — SODIUM CHLORIDE 0.9 % IV BOLUS
1000.0000 mL | Freq: Once | INTRAVENOUS | Status: AC
  Administered 2023-12-24: 1000 mL via INTRAVENOUS

## 2023-12-24 MED ORDER — PHENYLEPHRINE HCL-NACL 20-0.9 MG/250ML-% IV SOLN
INTRAVENOUS | Status: DC | PRN
  Administered 2023-12-24: 160 ug via INTRAVENOUS
  Administered 2023-12-24: 25 ug/min via INTRAVENOUS

## 2023-12-24 MED ORDER — PROPOFOL 10 MG/ML IV BOLUS
INTRAVENOUS | Status: DC | PRN
  Administered 2023-12-24: 120 mg via INTRAVENOUS

## 2023-12-24 MED ORDER — ROCURONIUM BROMIDE 10 MG/ML (PF) SYRINGE
PREFILLED_SYRINGE | INTRAVENOUS | Status: DC | PRN
  Administered 2023-12-24: 30 mg via INTRAVENOUS

## 2023-12-24 MED ORDER — SODIUM CHLORIDE 0.9 % IV SOLN: INTRAVENOUS | Status: DC | PRN

## 2023-12-24 MED ORDER — SUGAMMADEX SODIUM 200 MG/2ML IV SOLN
INTRAVENOUS | Status: DC | PRN
  Administered 2023-12-24: 200 mg via INTRAVENOUS

## 2023-12-24 MED ORDER — DEXAMETHASONE SODIUM PHOSPHATE 10 MG/ML IJ SOLN
INTRAMUSCULAR | Status: DC | PRN
  Administered 2023-12-24: 5 mg via INTRAVENOUS

## 2023-12-24 MED ORDER — MIDAZOLAM HCL 2 MG/2ML IJ SOLN
1.0000 mg | Freq: Once | INTRAMUSCULAR | Status: AC
  Administered 2023-12-24: 1 mg via INTRAMUSCULAR
  Filled 2023-12-24: qty 2

## 2023-12-24 NOTE — H&P (Signed)
 [name] is an 63 y.o. male.    Chief Complaint: Suspected esophageal food bolus impaction  HPI: 63 year old male with Down syndrome and profoundly mentally challenged, was brought to the ER by his sister as he has not been able to tolerate any food since yesterday morning.  He lives in a group home and has always had issues swallowing his medications and food.  However since he has not been able to tolerate anything orally and there were concerns of dehydration he was brought to the ER.  Past Medical History:  Diagnosis Date   Down syndrome    Dysphagia 08/10/2006   Esophageal ulcer 08/10/2006   Facial cellulitis 08/2022   related to left infranasal abscess   GERD (gastroesophageal reflux disease)     Past Surgical History:  Procedure Laterality Date   DENTAL RESTORATION/EXTRACTION WITH X-RAY N/A 07/31/2023   Procedure: DENTAL X-RAY CLISTA WITH X-RAY FULL MOUTH SCALING AND ROOT PLANNING EXTRACTIONS 2,10,15, AND 29;  Surgeon: Bonnell Margarito DEL, DMD;  Location: [facility] OR;  Service: Dentistry;  Laterality: N/A;   NO PAST SURGERIES      History reviewed. No pertinent family history. Social History:  reports that he has never smoked. He has never used smokeless tobacco. He reports that he does not drink alcohol and does not use drugs.  Allergies:  Allergies  Allergen Reactions   Ketamine Other (See Comments)    Drops respiration down to a dangerous level.    Actifed Cold-Allergy [Chlorpheniramine-Phenylephrine ] Other (See Comments)    Allergic, per facility   Amoxicillin Other (See Comments)    Allergic, per facility   Keflex [Cephalexin] Other (See Comments)    Allergic, per facility   Penicillins Other (See Comments)    Allergic, per facility    Medications Prior to Admission  Medication Sig Dispense Refill   acetaminophen  (TYLENOL ) 325 MG tablet Take 650 mg by mouth every 4 (four) hours as needed for fever (Greater than 100).     Cholecalciferol (VITAMIN D3) 1000  units CAPS Take 2,000 Units by mouth in the morning.     clotrimazole-betamethasone (LOTRISONE) cream Apply 1 Application topically 2 (two) times daily as needed (Apply to neck area for skin irritaion).     Emollient (MINERIN EX) Apply 1 application  topically See admin instructions. Apply to both arms, hands, and legs as needed     methenamine (HIPREX) 1 g tablet Take 1 g by mouth in the morning and at bedtime.     montelukast (SINGULAIR) 10 MG tablet Take 10 mg by mouth at bedtime.     Multiple Vitamin (MULTIVITAMIN) tablet Take 1 tablet by mouth daily with breakfast.     Multiple Vitamins-Minerals (OCUVITE ADULT 50+) CAPS Take 1 capsule by mouth in the morning.     Oyster Shell (OYSTER CALCIUM) 500 MG TABS tablet Take 500 mg of elemental calcium by mouth every evening.     promethazine (PHENERGAN) 25 MG tablet Take 25 mg by mouth every 6 (six) hours as needed for nausea or vomiting.     Sunscreens (SUNSCREEN ULTRA SHEER EX) Apply topically. Spf 100 May apply sunscreen over 30 PFS and chap stick as needed per manufacture direction to prevent sunburn     Vitamins A & D (VITAMIN A & D) ointment Apply 1 Application topically 3 (three) times daily.     white petrolatum ointment Apply 1 application  topically as needed (Apply to the lips in the morning and at bedtime for dryness).  Results for orders placed or performed during the hospital encounter of 12/24/23 (from the past 48 hours)  CBC with Differential     Status: Abnormal   Collection Time: 12/24/23  3:48 PM  Result Value Ref Range   WBC 3.9 (L) 4.0 - 10.5 K/uL   RBC 4.72 4.22 - 5.81 MIL/uL   Hemoglobin 14.8 13.0 - 17.0 g/dL   HCT 53.8 60.9 - 47.9 %   MCV 97.7 80.0 - 100.0 fL   MCH 31.4 26.0 - 34.0 pg   MCHC 32.1 30.0 - 36.0 g/dL   RDW 84.8 88.4 - 84.4 %   Platelets 254 150 - 400 K/uL   nRBC 0.0 0.0 - 0.2 %   Neutrophils Relative % 54 %   Neutro Abs 2.1 1.7 - 7.7 K/uL   Lymphocytes Relative 24 %   Lymphs Abs 0.9 0.7 - 4.0 K/uL    Monocytes Relative 19 %   Monocytes Absolute 0.7 0.1 - 1.0 K/uL   Eosinophils Relative 1 %   Eosinophils Absolute 0.0 0.0 - 0.5 K/uL   Basophils Relative 2 %   Basophils Absolute 0.1 0.0 - 0.1 K/uL   Immature Granulocytes 0 %   Abs Immature Granulocytes 0.01 0.00 - 0.07 K/uL    Comment: Performed at [facility], [address] [place], [place] [address]  Comprehensive metabolic panel     Status: None   Collection Time: 12/24/23  3:48 PM  Result Value Ref Range   Sodium 141 135 - 145 mmol/L   Potassium 4.3 3.5 - 5.1 mmol/L   Chloride 105 98 - 111 mmol/L   CO2 22 22 - 32 mmol/L   Glucose, Bld 83 70 - 99 mg/dL    Comment: Glucose reference range applies only to samples taken after fasting for at least 8 hours.   BUN 19 6 - 20 mg/dL   Creatinine, Ser 9.21 0.61 - 1.24 mg/dL   Calcium 9.2 8.9 - 89.6 mg/dL   Total Protein 7.6 6.5 - 8.1 g/dL   Albumin 3.5 3.5 - 5.0 g/dL   AST 32 15 - 41 U/L   ALT 16 0 - 44 U/L   Alkaline Phosphatase 61 38 - 126 U/L   Total Bilirubin 0.9 0.0 - 1.2 mg/dL   GFR, Estimated >39 >39 mL/min    Comment: (NOTE) Calculated using the CKD-EPI Creatinine Equation (2021)    Anion gap 14 5 - 15    Comment: Performed at [facility], [address], [place], [place] [address]  Lipase, blood     Status: None   Collection Time: 12/24/23  3:48 PM  Result Value Ref Range   Lipase 34 11 - 51 U/L    Comment: Performed at [facility], [address] [place], [place] [address]  Urinalysis, Routine w reflex microscopic -Urine, Catheterized     Status: Abnormal   Collection Time: 12/24/23  3:48 PM  Result Value Ref Range   Color, Urine YELLOW YELLOW   APPearance CLEAR CLEAR   Specific Gravity, Urine 1.029 1.005 - 1.030   pH 5.0 5.0 - 8.0   Glucose, UA NEGATIVE NEGATIVE mg/dL   Hgb urine dipstick NEGATIVE NEGATIVE   Bilirubin Urine NEGATIVE NEGATIVE   Ketones, ur 80 (A) NEGATIVE mg/dL   Protein, ur  NEGATIVE NEGATIVE mg/dL   Nitrite NEGATIVE NEGATIVE   Leukocytes,Ua NEGATIVE NEGATIVE    Comment: Performed at [facility], [address] [place], [place] [address]  No results found.  Review of Systems Unable to obtain  Blood pressure (!) 100/37, pulse 72, temperature (!) 97.1 F (36.2 C), temperature source Temporal, resp. rate 15, height 4' 10 (1.473 m), weight 49 kg, SpO2 98%. Physical Exam  Awake, unable to evaluate orientation Drooling but not in respiratory distress Regular rate and rhythm Deformed body habitus  Assessment/Plan Suspected esophageal food bolus impaction Emergent EGD with balloon dilatation with anesthesia. The risks and the benefits of the procedure were discussed with the patient's legal guardian, [name], who is present at bedside. She consents for the procedure.  [name] Manas, MD 12/24/2023, 8:50 PM

## 2023-12-24 NOTE — ED Provider Notes (Addendum)
 [name] AT [facility] Provider Note   CSN: [id number] Arrival date & time: 12/24/23  1450     Patient presents with: Hypertension   [name] is a 63 y.o. male.   Pt is a 63 yo male with Down's Syndrome and profound MR.  Pt is brought to the ED today because of agitation and not swallowing his meds or food.  Pt's caregiver said he's been spitting out all meds and food.  He is unable to talk and tell us  if he's hurting.  He has not had fever.  No vomiting.       Prior to Admission medications   Medication Sig Start Date End Date Taking? Authorizing Provider  acetaminophen  (TYLENOL ) 325 MG tablet Take 650 mg by mouth every 4 (four) hours as needed for fever (Greater than 100).    [provider]  Cholecalciferol (VITAMIN D3) 1000 units CAPS Take 2,000 Units by mouth in the morning.    [provider]  clotrimazole-betamethasone (LOTRISONE) cream Apply 1 Application topically 2 (two) times daily as needed (Apply to neck area for skin irritaion).    [provider]  Emollient [facility] EX) Apply 1 application  topically See admin instructions. Apply to both arms, hands, and legs as needed    [provider]  methenamine (HIPREX) 1 g tablet Take 1 g by mouth in the morning and at bedtime.    [provider]  montelukast (SINGULAIR) 10 MG tablet Take 10 mg by mouth at bedtime.    [provider]  Multiple Vitamin (MULTIVITAMIN) tablet Take 1 tablet by mouth daily with breakfast.    [provider]  Multiple Vitamins-Minerals (OCUVITE ADULT 50+) CAPS Take 1 capsule by mouth in the morning.    [provider]  [name] (OYSTER CALCIUM) 500 MG TABS tablet Take 500 mg of elemental calcium by mouth every evening.    [provider]  promethazine (PHENERGAN) 25 MG tablet Take 25 mg by mouth every 6 (six) hours as needed for nausea or vomiting.    [provider]   Sunscreens (SUNSCREEN ULTRA SHEER EX) Apply topically. Spf 100 May apply sunscreen over 30 PFS and chap stick as needed per manufacture direction to prevent sunburn    [provider]  Vitamins A & D (VITAMIN A & D) ointment Apply 1 Application topically 3 (three) times daily.    [provider]  white petrolatum ointment Apply 1 application  topically as needed (Apply to the lips in the morning and at bedtime for dryness).    [provider]    Allergies: Ketamine, Actifed cold-allergy [chlorpheniramine-phenylephrine ], Amoxicillin, Keflex [cephalexin], and Penicillins    Review of Systems  Psychiatric/Behavioral:  Positive for agitation.   All other systems reviewed and are negative.   Updated Vital Signs BP (!) 143/121 (BP Location: Left Arm)   Pulse 88   Temp 98.5 F (36.9 C)   Resp 17   Ht 4' 10 (1.473 m)   Wt 49 kg   SpO2 99%   BMI 22.58 kg/m   Physical Exam Vitals and nursing note reviewed.  Constitutional:      Comments: Down's Syndrome facies  HENT:     Head: Normocephalic.     Right Ear: External ear normal.     Left Ear: External ear normal.     Nose: Nose normal.     Mouth/Throat:     Mouth: Mucous membranes are dry.  Eyes:  Pupils: Pupils are equal, round, and reactive to light.  Cardiovascular:     Rate and Rhythm: Normal rate and regular rhythm.     Pulses: Normal pulses.     Heart sounds: Normal heart sounds.  Pulmonary:     Effort: Pulmonary effort is normal.     Breath sounds: Normal breath sounds.  Abdominal:     General: Abdomen is flat.  Musculoskeletal:     Cervical back: Normal range of motion and neck supple.  Skin:    General: Skin is warm.     Capillary Refill: Capillary refill takes less than 2 seconds.  Neurological:     Mental Status: He is alert.     Comments: Pt is nonverbal.  He is moving his arms/legs  Psychiatric:        Behavior: Behavior is agitated.     (all labs ordered are listed, but  only abnormal results are displayed) Labs Reviewed  CBC WITH DIFFERENTIAL/PLATELET - Abnormal; Notable for the following components:      Result Value   WBC 3.9 (*)    All other components within normal limits  URINALYSIS, ROUTINE W REFLEX MICROSCOPIC - Abnormal; Notable for the following components:   Ketones, ur 80 (*)    All other components within normal limits  COMPREHENSIVE METABOLIC PANEL WITH GFR  LIPASE, BLOOD    EKG: None  Radiology: No results found.   Procedures   Medications Ordered in the ED  sodium chloride  0.9 % bolus 1,000 mL (1,000 mLs Intravenous New Bag/Given 12/24/23 1716)  midazolam  (VERSED ) injection 1 mg (1 mg Intramuscular Given 12/24/23 1622)                                    Medical Decision Making Amount and/or Complexity of Data Reviewed Labs: ordered. Radiology: ordered.  Risk Prescription drug management. Decision regarding hospitalization.   This patient presents to the ED for concern of agitation, this involves an extensive number of treatment options, and is a complaint that carries with it a high risk of complications and morbidity.  The differential diagnosis includes infection, electrolyte abn   Co morbidities that complicate the patient evaluation  Down's Syndrome and profound MR   Additional history obtained:  Additional history obtained from epic chart review External records from outside source obtained and reviewed including caregiver   Lab Tests:  I Ordered, and personally interpreted labs.  The pertinent results include:  cbc nl, cmp nl, ua neg for uti, + ketones   Medicines ordered and prescription drug management:  I ordered medication including zofran /ivfs  for sx  Reevaluation of the patient after these medicines showed that the patient improved I have reviewed the patients home medicines and have made adjustments as needed   Test Considered:  ct   Critical Interventions:  GI  consult   Consultations Obtained:  I requested consultation with the gastroenterologist (Dr. Saintclair),  and discussed lab and imaging findings as well as pertinent plan -she will take pt to the endoscopy suite   Problem List / ED Course:  Suspected food impaction:  pt has a hx of dysphagia.  His sister said he's not seen GI any time recently.  We tried a po challenge.  He is thirsty and did drink, but it came back up a few minutes later.  Sister said he is DNR just in case things do not go well in the EGD  suite.   Reevaluation:  After the interventions noted above, I reevaluated the patient and found that they have :improved   Social Determinants of Health:  Lives at group home   Dispostion:  After consideration of the diagnostic results and the patients response to treatment, I feel that the patent would benefit from EGD.    Pt did go to endoscopy and had a successful removal of the food bolus.  Pt is stable for d/c.  Return if worse.      Final diagnoses:  Food impaction of esophagus, initial encounter    ED Discharge Orders     None          Dean Clarity, MD 12/24/23 2034    Dean Clarity, MD 12/24/23 2309

## 2023-12-24 NOTE — Op Note (Signed)
 [facility] Patient Name: [name] Procedure Date: 12/24/2023 MRN: [id number] Attending MD: Estelita Manas , MD, [id number] Date of Birth: 08/01/1960 CSN: [id number] Age: 63 Admit Type: Outpatient Procedure:                Upper GI endoscopy Indications:              Foreign body in the esophagus Providers:                Estelita Manas, MD, Olam Riedel, RN, Felice Sar,                            Technician Referring MD:             ED Medicines:                Monitored Anesthesia Care Complications:            No immediate complications. Estimated Blood Loss:     Estimated blood loss: none. Procedure:                Pre-Anesthesia Assessment:                           - Prior to the procedure, a History and Physical                            was performed, and patient medications and                            allergies were reviewed. The patient's tolerance of                            previous anesthesia was also reviewed. The risks                            and benefits of the procedure and the sedation                            options and risks were discussed with the patient.                            All questions were answered, and informed consent                            was obtained. Prior Anticoagulants: The patient has                            taken no anticoagulant or antiplatelet agents. ASA                            Grade Assessment: III - A patient with severe                            systemic disease. After reviewing the risks and  benefits, the patient was deemed in satisfactory                            condition to undergo the procedure.                           After obtaining informed consent, the endoscope was                            passed under direct vision. Throughout the                            procedure, the patient's blood pressure, pulse, and                            oxygen saturations  were monitored continuously. The                            [id number]-H190 ([id number]) Olympus endoscope was introduced                            through the mouth, and advanced to the second part                            of duodenum. The upper GI endoscopy was                            accomplished without difficulty. The patient                            tolerated the procedure well. Scope In: Scope Out: Findings:      Food was found in the middle third of the esophagus and in the lower       third of the esophagus. Removal was accomplished with a regular forceps       and Roth net.      LA Grade D (one or more mucosal breaks involving at least 75% of       esophageal circumference) esophagitis with no bleeding was found 35 to       40 cm from the incisors.      The entire examined stomach was normal.      The cardia and gastric fundus were normal on retroflexion.      The examined duodenum was normal. Impression:               - Food in the middle third of the esophagus and in                            the lower third of the esophagus. Removal was                            successful.                           - LA Grade D erosive esophagitis with no bleeding.                           -  Normal stomach.                           - Normal examined duodenum. Moderate Sedation:      Patient did not receive moderate sedation for this procedure, but       instead received monitored anesthesia care. Recommendation:           - Mechanical soft diet.                           - Patient has a contact number available for                            emergencies. The signs and symptoms of potential                            delayed complications were discussed with the                            patient. Return to normal activities tomorrow.                            Written discharge instructions were provided to the                            patient. Procedure Code(s):        ---  Professional ---                           [phone], Esophagogastroduodenoscopy, flexible,                            transoral; with removal of foreign body(s) Diagnosis Code(s):        --- Professional ---                           U81.871J, Food in esophagus causing other injury,                            initial encounter                           K20.80, Other esophagitis without bleeding                           T18.108A, Unspecified foreign body in esophagus                            causing other injury, initial encounter CPT copyright 2022 American Medical Association. All rights reserved. The codes documented in this report are preliminary and upon coder review may  be revised to meet current compliance requirements. Estelita Manas, MD 12/24/2023 9:33:44 PM This report has been signed electronically. Number of Addenda: 0

## 2023-12-24 NOTE — ED Triage Notes (Signed)
 Pt bib [facility] for hypertension, refused to eat today and yesterday. Threw medicine yesterday. Medics noticed increase agitation with caregiver

## 2023-12-24 NOTE — Transfer of Care (Signed)
 Immediate Anesthesia Transfer of Care Note  Patient: [name]  Procedure(s) Performed: EGD (ESOPHAGOGASTRODUODENOSCOPY)  Patient Location: PACU  Anesthesia Type:General  Level of Consciousness: awake  Airway & Oxygen Therapy: Patient Spontanous Breathing  Post-op Assessment: Report given to RN and Post -op Vital signs reviewed and stable  Post vital signs: Reviewed and stable  Last Vitals:  Vitals Value Taken Time  BP 138/90 12/24/23 21:54  Temp    Pulse    Resp 19 12/24/23 21:55  SpO2    Vitals shown include unfiled device data.  Last Pain:  Vitals:   12/24/23 2036  TempSrc: Temporal  PainSc: 0-No pain         Complications: No notable events documented.

## 2023-12-24 NOTE — Anesthesia Preprocedure Evaluation (Addendum)
 Anesthesia Evaluation  Patient identified by MRN, date of birth, ID band Patient awake    Reviewed: Allergy & Precautions, Patient's Chart, lab work & pertinent test resultsPreop documentation limited or incomplete due to emergent nature of procedure.  Airway Mallampati: Unable to assess       Dental  (+) Poor Dentition, Chipped, Dental Advisory Given   Pulmonary neg pulmonary ROS    + decreased breath sounds      Cardiovascular  Rhythm:Regular Rate:Normal     Neuro/Psych negative neurological ROS     GI/Hepatic Neg liver ROS, PUD,GERD  ,,  Endo/Other  negative endocrine ROS    Renal/GU negative Renal ROS     Musculoskeletal negative musculoskeletal ROS (+)    Abdominal   Peds  (+) Developmental delay Hematology negative hematology ROS (+)   Anesthesia Other Findings   Reproductive/Obstetrics                              Anesthesia Physical Anesthesia Plan  ASA: 2 and emergent  Anesthesia Plan: General   Post-op Pain Management: Minimal or no pain anticipated   Induction: Intravenous, Rapid sequence and Cricoid pressure planned  PONV Risk Score and Plan: 2 and Ondansetron  and Midazolam   Airway Management Planned: Oral ETT  Additional Equipment: None  Intra-op Plan:   Post-operative Plan: Extubation in OR  Informed Consent: I have reviewed the patients History and Physical, chart, labs and discussed the procedure including the risks, benefits and alternatives for the proposed anesthesia with the patient or authorized representative who has indicated his/her understanding and acceptance.       Plan Discussed with: CRNA  Anesthesia Plan Comments:          Anesthesia Quick Evaluation

## 2023-12-24 NOTE — Anesthesia Procedure Notes (Signed)
 Procedure Name: Intubation Date/Time: 12/24/2023 9:05 PM  Performed by: Cena Epps, CRNAPre-anesthesia Checklist: Patient identified, Emergency Drugs available, Suction available and Patient being monitored Patient Re-evaluated:Patient Re-evaluated prior to induction Oxygen Delivery Method: Circle System Utilized Preoxygenation: Pre-oxygenation with 100% oxygen Induction Type: IV induction, Rapid sequence and Cricoid Pressure applied Ventilation: Mask ventilation without difficulty Laryngoscope Size: Glidescope and 3 Grade View: Grade I Tube type: Oral Tube size: 7.0 mm Number of attempts: 1 Airway Equipment and Method: Stylet and Oral airway Placement Confirmation: ETT inserted through vocal cords under direct vision, positive ETCO2 and breath sounds checked- equal and bilateral Secured at: 22 cm Tube secured with: Tape Dental Injury: Teeth and Oropharynx as per pre-operative assessment

## 2023-12-26 ENCOUNTER — Encounter (HOSPITAL_COMMUNITY): Payer: Self-pay | Admitting: Gastroenterology

## 2023-12-27 NOTE — Anesthesia Postprocedure Evaluation (Signed)
 Anesthesia Post Note  Patient: Keni Elison  Procedure(s) Performed: EGD (ESOPHAGOGASTRODUODENOSCOPY)     Patient location during evaluation: PACU Anesthesia Type: General Level of consciousness: awake and alert Pain management: pain level controlled Vital Signs Assessment: post-procedure vital signs reviewed and stable Respiratory status: spontaneous breathing, nonlabored ventilation, respiratory function stable and patient connected to nasal cannula oxygen Cardiovascular status: blood pressure returned to baseline and stable Postop Assessment: no apparent nausea or vomiting Anesthetic complications: no   No notable events documented.  Last Vitals:  Vitals:   12/24/23 2154 12/24/23 2155  BP: (!) 138/90   Pulse:    Resp: (!) 21 19  Temp:    SpO2: 98%     Last Pain:  Vitals:   12/24/23 2036  TempSrc: Temporal  PainSc: 0-No pain                 [name]

## 2024-04-27 ENCOUNTER — Encounter (HOSPITAL_COMMUNITY): Payer: Self-pay

## 2024-04-27 ENCOUNTER — Encounter (HOSPITAL_COMMUNITY): Admission: EM | Disposition: A | Payer: Self-pay | Source: Home / Self Care | Attending: Emergency Medicine

## 2024-04-27 ENCOUNTER — Emergency Department (HOSPITAL_COMMUNITY)
Admission: EM | Admit: 2024-04-27 | Discharge: 2024-04-28 | Disposition: A | Attending: Emergency Medicine | Admitting: Emergency Medicine

## 2024-04-27 DIAGNOSIS — W44F3XA Food entering into or through a natural orifice, initial encounter: Secondary | ICD-10-CM | POA: Diagnosis not present

## 2024-04-27 DIAGNOSIS — T18128A Food in esophagus causing other injury, initial encounter: Secondary | ICD-10-CM | POA: Diagnosis present

## 2024-04-27 DIAGNOSIS — T189XXA Foreign body of alimentary tract, part unspecified, initial encounter: Secondary | ICD-10-CM | POA: Diagnosis not present

## 2024-04-27 DIAGNOSIS — T182XXA Foreign body in stomach, initial encounter: Secondary | ICD-10-CM | POA: Diagnosis not present

## 2024-04-27 DIAGNOSIS — K2100 Gastro-esophageal reflux disease with esophagitis, without bleeding: Secondary | ICD-10-CM | POA: Diagnosis not present

## 2024-04-27 DIAGNOSIS — R451 Restlessness and agitation: Secondary | ICD-10-CM | POA: Diagnosis not present

## 2024-04-27 HISTORY — PX: ESOPHAGOGASTRODUODENOSCOPY: SHX5428

## 2024-04-27 LAB — CBC WITH DIFFERENTIAL/PLATELET
Abs Immature Granulocytes: 0.02 K/uL (ref 0.00–0.07)
Basophils Absolute: 0.1 K/uL (ref 0.0–0.1)
Basophils Relative: 2 %
Eosinophils Absolute: 0.2 K/uL (ref 0.0–0.5)
Eosinophils Relative: 4 %
HCT: 44.3 % (ref 39.0–52.0)
Hemoglobin: 14.7 g/dL (ref 13.0–17.0)
Immature Granulocytes: 1 %
Lymphocytes Relative: 28 %
Lymphs Abs: 1.2 K/uL (ref 0.7–4.0)
MCH: 32.2 pg (ref 26.0–34.0)
MCHC: 33.2 g/dL (ref 30.0–36.0)
MCV: 97.1 fL (ref 80.0–100.0)
Monocytes Absolute: 0.8 K/uL (ref 0.1–1.0)
Monocytes Relative: 17 %
Neutro Abs: 2.2 K/uL (ref 1.7–7.7)
Neutrophils Relative %: 48 %
Platelets: 295 K/uL (ref 150–400)
RBC: 4.56 MIL/uL (ref 4.22–5.81)
RDW: 13.4 % (ref 11.5–15.5)
WBC: 4.4 K/uL (ref 4.0–10.5)
nRBC: 0 % (ref 0.0–0.2)

## 2024-04-27 LAB — COMPREHENSIVE METABOLIC PANEL WITH GFR
ALT: 13 U/L (ref 0–44)
AST: 26 U/L (ref 15–41)
Albumin: 3.8 g/dL (ref 3.5–5.0)
Alkaline Phosphatase: 77 U/L (ref 38–126)
Anion gap: 10 (ref 5–15)
BUN: 17 mg/dL (ref 8–23)
CO2: 26 mmol/L (ref 22–32)
Calcium: 9.1 mg/dL (ref 8.9–10.3)
Chloride: 103 mmol/L (ref 98–111)
Creatinine, Ser: 0.7 mg/dL (ref 0.61–1.24)
GFR, Estimated: >60 mL/min (ref >60)
Glucose, Bld: 100 mg/dL — ABNORMAL HIGH (ref 70–99)
Potassium: 4 mmol/L (ref 3.5–5.1)
Sodium: 139 mmol/L (ref 135–145)
Total Bilirubin: 0.3 mg/dL (ref 0.0–1.2)
Total Protein: 7.2 g/dL (ref 6.5–8.1)

## 2024-04-27 SURGERY — EGD (ESOPHAGOGASTRODUODENOSCOPY)
Anesthesia: Monitor Anesthesia Care

## 2024-04-27 MED ORDER — ONDANSETRON HCL 4 MG/2ML IJ SOLN
4.0000 mg | Freq: Once | INTRAMUSCULAR | Status: AC
  Administered 2024-04-27: 4 mg via INTRAVENOUS
  Filled 2024-04-27: qty 2

## 2024-04-27 MED ORDER — SODIUM CHLORIDE 0.9 % IV BOLUS
1000.0000 mL | Freq: Once | INTRAVENOUS | Status: AC
  Administered 2024-04-27: 1000 mL via INTRAVENOUS

## 2024-04-27 NOTE — ED Triage Notes (Signed)
 Patient is from a group home. They witnessed him vomit 3 times. Patient has a history of down syndrome and he is nonverbal.

## 2024-04-27 NOTE — Anesthesia Preprocedure Evaluation (Signed)
 Anesthesia Evaluation  Patient identified by MRN, date of birth, ID band Patient awake    Reviewed: Allergy & Precautions, Patient's Chart, lab work & pertinent test resultsPreop documentation limited or incomplete due to emergent nature of procedure.  Airway Mallampati: Unable to assess       Dental  (+) Poor Dentition, Chipped, Dental Advisory Given   Pulmonary neg pulmonary ROS    + decreased breath sounds      Cardiovascular  Rhythm:Regular Rate:Normal     Neuro/Psych negative neurological ROS     GI/Hepatic Neg liver ROS, PUD,GERD  ,,  Endo/Other  negative endocrine ROS    Renal/GU negative Renal ROS     Musculoskeletal negative musculoskeletal ROS (+)    Abdominal   Peds  (+) Developmental delay Hematology negative hematology ROS (+)   Anesthesia Other Findings   Reproductive/Obstetrics                              Anesthesia Physical Anesthesia Plan  ASA: 2 and emergent  Anesthesia Plan: General   Post-op Pain Management: Minimal or no pain anticipated   Induction: Intravenous, Rapid sequence and Cricoid pressure planned  PONV Risk Score and Plan: 2 and Ondansetron  and Midazolam   Airway Management Planned: Oral ETT  Additional Equipment: None  Intra-op Plan:   Post-operative Plan: Extubation in OR  Informed Consent: I have reviewed the patients History and Physical, chart, labs and discussed the procedure including the risks, benefits and alternatives for the proposed anesthesia with the patient or authorized representative who has indicated his/her understanding and acceptance.       Plan Discussed with: CRNA  Anesthesia Plan Comments:          Anesthesia Quick Evaluation

## 2024-04-27 NOTE — ED Provider Notes (Signed)
 [place] EMERGENCY DEPARTMENT AT [facility] Provider Note   CSN: [id number] Arrival date & time: 04/27/24  2025     Patient presents with: No chief complaint on file.   [name] is a 63 y.o. male.   Pt is a 63 yo male with pmhx significant for Down Syndrome, GERD, and esophageal impaction.  Pt's sister gives hx.  Pt is supposed to be hand fed pureed food, but the group home has been letting him eat by himself.  She think he eats and does not chew.   Today, pt developed some vomiting after lunch.  Group home worker said he is thirsty and hungry and tries to eat, but it comes back up after a minute.       Prior to Admission medications   Medication Sig Start Date End Date Taking? Authorizing Provider  acetaminophen  (TYLENOL ) 325 MG tablet Take 650 mg by mouth every 4 (four) hours as needed for fever (Greater than 100).    [provider]  Cholecalciferol (VITAMIN D3) 1000 units CAPS Take 2,000 Units by mouth in the morning.    [provider]  clotrimazole-betamethasone (LOTRISONE) cream Apply 1 Application topically 2 (two) times daily as needed (Apply to neck area for skin irritaion).    [provider]  Emollient [facility] EX) Apply 1 application  topically See admin instructions. Apply to both arms, hands, and legs as needed    [provider]  methenamine (HIPREX) 1 g tablet Take 1 g by mouth in the morning and at bedtime.    [provider]  montelukast (SINGULAIR) 10 MG tablet Take 10 mg by mouth at bedtime.    [provider]  Multiple Vitamin (MULTIVITAMIN) tablet Take 1 tablet by mouth daily with breakfast.    [provider]  Multiple Vitamins-Minerals (OCUVITE ADULT 50+) CAPS Take 1 capsule by mouth in the morning.    [provider]  [name] (OYSTER CALCIUM) 500 MG TABS tablet Take 500 mg of elemental calcium by mouth every evening.    [provider]  promethazine  (PHENERGAN) 25 MG tablet Take 25 mg by mouth every 6 (six) hours as needed for nausea or vomiting.    [provider]  Sunscreens (SUNSCREEN ULTRA SHEER EX) Apply topically. Spf 100 May apply sunscreen over 30 PFS and chap stick as needed per manufacture direction to prevent sunburn    [provider]  Vitamins A & D (VITAMIN A & D) ointment Apply 1 Application topically 3 (three) times daily.    [provider]  white petrolatum ointment Apply 1 application  topically as needed (Apply to the lips in the morning and at bedtime for dryness).    [provider]    Allergies: Ketamine, Actifed cold-allergy [chlorpheniramine-phenylephrine ], Amoxicillin, Keflex [cephalexin], and Penicillins    Review of Systems  Gastrointestinal:  Positive for vomiting.  All other systems reviewed and are negative.   Updated Vital Signs BP (!) 135/112 (BP Location: Left Arm)   Pulse 84   Temp 98 F (36.7 C) (Axillary)   Resp 18   SpO2 95%   Physical Exam Vitals and nursing note reviewed.  HENT:     Head:     Comments: Down Syndrome Facies    Right Ear: External ear normal.     Left Ear: External ear normal.     Nose: Nose normal.     Mouth/Throat:     Mouth: Mucous membranes are dry.  Eyes:  Pupils: Pupils are equal, round, and reactive to light.  Cardiovascular:     Rate and Rhythm: Normal rate and regular rhythm.     Pulses: Normal pulses.     Heart sounds: Normal heart sounds.  Pulmonary:     Effort: Pulmonary effort is normal.     Breath sounds: Normal breath sounds.  Abdominal:     General: Abdomen is flat. Bowel sounds are normal.     Palpations: Abdomen is soft.  Musculoskeletal:        General: Normal range of motion.     Cervical back: Normal range of motion and neck supple.  Skin:    General: Skin is warm.     Capillary Refill: Capillary refill takes less than 2 seconds.  Neurological:     Mental Status: He is alert. Mental status is at  baseline.  Psychiatric:        Behavior: Behavior is agitated.     (all labs ordered are listed, but only abnormal results are displayed) Labs Reviewed  COMPREHENSIVE METABOLIC PANEL WITH GFR - Abnormal; Notable for the following components:      Result Value   Glucose, Bld 100 (*)    All other components within normal limits  CBC WITH DIFFERENTIAL/PLATELET  URINALYSIS, ROUTINE W REFLEX MICROSCOPIC    EKG: None  Radiology: No results found.   Procedures   Medications Ordered in the ED  ondansetron  (ZOFRAN ) injection 4 mg (4 mg Intravenous Given 04/27/24 2106)  sodium chloride  0.9 % bolus 1,000 mL (1,000 mLs Intravenous New Bag/Given 04/27/24 2148)                                    Medical Decision Making Amount and/or Complexity of Data Reviewed Labs: ordered.  Risk Prescription drug management. Decision regarding hospitalization.   This patient presents to the ED for concern of vomiting, this involves an extensive number of treatment options, and is a complaint that carries with it a high risk of complications and morbidity.  The differential diagnosis includes esophageal bolus, infection   Co morbidities that complicate the patient evaluation  Down Syndrome, GERD, and esophageal impaction   Additional history obtained:  Additional history obtained from epic chart review External records from outside source obtained and reviewed including group home worker, sister   Lab Tests:  I Ordered, and personally interpreted labs.  The pertinent results include:  cbc nl, cmp nl   Medicines ordered and prescription drug management:  I ordered medication including zofran /ivfs  for sx  Reevaluation of the patient after these medicines showed that the patient improved I have reviewed the patients home medicines and have made adjustments as needed   Critical Interventions:  GI consult   Consultations Obtained:  I requested consultation with the  gastroenterologist (Dr. Rosalie),  and discussed lab and imaging findings as well as pertinent plan - he will come in and do an EGD.   Problem List / ED Course:  Esophageal food impaction:  pending EGD   Reevaluation:  After the interventions noted above, I reevaluated the patient and found that they have :improved   Social Determinants of Health:  Lives at home   Dispostion:  I anticipate d/c after EGD.     Final diagnoses:  Esophageal obstruction due to food impaction    ED Discharge Orders     None          Yelena Metzer,  Mliss, MD 04/27/24 2314

## 2024-04-28 ENCOUNTER — Emergency Department (HOSPITAL_COMMUNITY): Admitting: Anesthesiology

## 2024-04-28 ENCOUNTER — Encounter (HOSPITAL_COMMUNITY): Payer: Self-pay | Admitting: Gastroenterology

## 2024-04-28 DIAGNOSIS — T18128A Food in esophagus causing other injury, initial encounter: Secondary | ICD-10-CM | POA: Diagnosis not present

## 2024-04-28 MED ORDER — ALBUMIN HUMAN 5 % IV SOLN: INTRAVENOUS | Status: DC | PRN

## 2024-04-28 MED ORDER — SUCCINYLCHOLINE CHLORIDE 200 MG/10ML IV SOSY
PREFILLED_SYRINGE | INTRAVENOUS | Status: DC | PRN
  Administered 2024-04-28: 120 mg via INTRAVENOUS

## 2024-04-28 MED ORDER — ONDANSETRON HCL 4 MG/2ML IJ SOLN
INTRAMUSCULAR | Status: DC | PRN
  Administered 2024-04-28: 4 mg via INTRAVENOUS

## 2024-04-28 MED ORDER — PROPOFOL 10 MG/ML IV BOLUS
INTRAVENOUS | Status: DC | PRN
  Administered 2024-04-28: 130 mg via INTRAVENOUS

## 2024-04-28 MED ORDER — SODIUM CHLORIDE 0.9 % IV SOLN: INTRAVENOUS | Status: DC

## 2024-04-28 MED ORDER — EPHEDRINE SULFATE-NACL 50-0.9 MG/10ML-% IV SOSY
PREFILLED_SYRINGE | INTRAVENOUS | Status: DC | PRN
  Administered 2024-04-28: 5 mg via INTRAVENOUS

## 2024-04-28 MED ORDER — DEXAMETHASONE SOD PHOSPHATE PF 10 MG/ML IJ SOLN
INTRAMUSCULAR | Status: DC | PRN
  Administered 2024-04-28: 10 mg via INTRAVENOUS

## 2024-04-28 MED ORDER — PHENYLEPHRINE 80 MCG/ML (10ML) SYRINGE FOR IV PUSH (FOR BLOOD PRESSURE SUPPORT)
PREFILLED_SYRINGE | INTRAVENOUS | Status: DC | PRN
  Administered 2024-04-28 (×3): 160 ug via INTRAVENOUS

## 2024-04-28 MED ORDER — LIDOCAINE 2% (20 MG/ML) 5 ML SYRINGE
INTRAMUSCULAR | Status: DC | PRN
  Administered 2024-04-28: 50 mg via INTRAVENOUS

## 2024-04-28 MED ORDER — LACTATED RINGERS IV SOLN: INTRAVENOUS | Status: DC | PRN

## 2024-04-28 NOTE — ED Provider Notes (Signed)
 Patient returned post endoscopy and is awake and alert per his norm.  RRR CTAB NABS  PO challenged successfully in the ED.  Stable for discharge    Anquinette Pierro, MD 04/28/24 9748

## 2024-04-28 NOTE — ED Notes (Signed)
 Patient back from Endo with family at bedside.

## 2024-04-28 NOTE — Discharge Instructions (Signed)
 Liquids only until lunch tomorrow and if doing well may have his usual diet and call if GI question or problem otherwise give him 1 pantoprazole a day long-term

## 2024-04-28 NOTE — Transfer of Care (Signed)
 Immediate Anesthesia Transfer of Care Note  Patient: [name]  Procedure(s) Performed: EGD (ESOPHAGOGASTRODUODENOSCOPY)  Patient Location: PACU  Anesthesia Type:General  Level of Consciousness: drowsy  Airway & Oxygen Therapy: Patient Spontanous Breathing  Post-op Assessment: Report given to RN and Post -op Vital signs reviewed and stable  Post vital signs: Reviewed and stable  Last Vitals:  Vitals Value Taken Time  BP 129/107 04/28/24 00:45  Temp 35.9 C 04/28/24 00:36  Pulse 98 04/28/24 00:46  Resp 31 04/28/24 00:47  SpO2 90 % 04/28/24 00:46  Vitals shown include unfiled device data.  Last Pain:  Vitals:   04/28/24 0036  TempSrc: Temporal         Complications: No notable events documented.

## 2024-04-28 NOTE — Consult Note (Signed)
 Reason for Consult: Nausea vomiting concerns over food impaction Referring Physician: ER physician  [name] is an 63 y.o. male.  HPI: Patient seen and examined and case discussed with the ER physician and his power of attorney his sister and he had this happen 1 time in August and he supposed to have his food cry and did up and they are supposed to feed him at the rest home but unfortunately they just supposedly watched him feed himself and while eating lunch today he quit eating and has vomited everything that has been given to him since and he is nonverbal and has no other complaints and his hospital computer chart was reviewed  Past Medical History:  Diagnosis Date   Down syndrome    Dysphagia 08/10/2006   Esophageal ulcer 08/10/2006   Facial cellulitis 08/2022   related to left infranasal abscess   GERD (gastroesophageal reflux disease)     Past Surgical History:  Procedure Laterality Date   DENTAL RESTORATION/EXTRACTION WITH X-RAY N/A 07/31/2023   Procedure: DENTAL X-RAY CLISTA WITH X-RAY FULL MOUTH SCALING AND ROOT PLANNING EXTRACTIONS 2,10,15, AND 29;  Surgeon: Bonnell Margarito DEL, DMD;  Location: [facility] OR;  Service: Dentistry;  Laterality: N/A;   ESOPHAGOGASTRODUODENOSCOPY N/A 12/24/2023   Procedure: EGD (ESOPHAGOGASTRODUODENOSCOPY);  Surgeon: Saintclair Jasper, MD;  Location: [facility];  Service: Gastroenterology;  Laterality: N/A;   NO PAST SURGERIES      History reviewed. No pertinent family history.  Social History:  reports that he has never smoked. He has never used smokeless tobacco. He reports that he does not drink alcohol and does not use drugs.  Allergies:  Allergies  Allergen Reactions   Ketamine Other (See Comments)    Drops respiration down to a dangerous level.    Actifed Cold-Allergy [Chlorpheniramine-Phenylephrine ] Other (See Comments)    Allergic, per facility   Amoxicillin Other (See Comments)    Allergic, per facility   Keflex [Cephalexin] Other  (See Comments)    Allergic, per facility   Penicillins Other (See Comments)    Allergic, per facility    Medications: I have reviewed the patient's current medications.  Results for orders placed or performed during the hospital encounter of 04/27/24 (from the past 48 hours)  Comprehensive metabolic panel     Status: Abnormal   Collection Time: 04/27/24  9:02 PM  Result Value Ref Range   Sodium 139 135 - 145 mmol/L   Potassium 4.0 3.5 - 5.1 mmol/L   Chloride 103 98 - 111 mmol/L   CO2 26 22 - 32 mmol/L   Glucose, Bld 100 (H) 70 - 99 mg/dL    Comment: Glucose reference range applies only to samples taken after fasting for at least 8 hours.   BUN 17 8 - 23 mg/dL   Creatinine, Ser 9.29 0.61 - 1.24 mg/dL   Calcium 9.1 8.9 - 89.6 mg/dL   Total Protein 7.2 6.5 - 8.1 g/dL   Albumin 3.8 3.5 - 5.0 g/dL   AST 26 15 - 41 U/L   ALT 13 0 - 44 U/L   Alkaline Phosphatase 77 38 - 126 U/L   Total Bilirubin 0.3 0.0 - 1.2 mg/dL   GFR, Estimated >39 >39 mL/min    Comment: (NOTE) Calculated using the CKD-EPI Creatinine Equation (2021)    Anion gap 10 5 - 15    Comment: Performed at [facility], [address] [place], [place] [address]  CBC with Differential     Status: None  Collection Time: 04/27/24  9:02 PM  Result Value Ref Range   WBC 4.4 4.0 - 10.5 K/uL   RBC 4.56 4.22 - 5.81 MIL/uL   Hemoglobin 14.7 13.0 - 17.0 g/dL   HCT 55.6 60.9 - 47.9 %   MCV 97.1 80.0 - 100.0 fL   MCH 32.2 26.0 - 34.0 pg   MCHC 33.2 30.0 - 36.0 g/dL   RDW 86.5 88.4 - 84.4 %   Platelets 295 150 - 400 K/uL   nRBC 0.0 0.0 - 0.2 %   Neutrophils Relative % 48 %   Neutro Abs 2.2 1.7 - 7.7 K/uL   Lymphocytes Relative 28 %   Lymphs Abs 1.2 0.7 - 4.0 K/uL   Monocytes Relative 17 %   Monocytes Absolute 0.8 0.1 - 1.0 K/uL   Eosinophils Relative 4 %   Eosinophils Absolute 0.2 0.0 - 0.5 K/uL   Basophils Relative 2 %   Basophils Absolute 0.1 0.0 - 0.1 K/uL   Immature Granulocytes 1 %   Abs  Immature Granulocytes 0.02 0.00 - 0.07 K/uL    Comment: Performed at [facility], [address], [place], [place] [address]    No results found.  Review of Systems negative except above Blood pressure (!) 135/112, pulse 90, temperature 97.6 F (36.4 C), temperature source Temporal, resp. rate 14, height 4' 10 (1.473 m), weight 49.9 kg, SpO2 92%. Physical Exam vital signs stable afebrile no acute distress exam please see preassessment evaluation  Assessment/Plan: Probable food impaction Plan: Okay to proceed with endoscopy with anesthesia assistance and case discussed with the power of attorney his sister who agrees  [facility] E 04/28/2024, 12:07 AM

## 2024-04-28 NOTE — ED Notes (Signed)
Pt had a successful PO challenge.

## 2024-04-28 NOTE — Op Note (Signed)
 [facility] Patient Name: [name] Procedure Date: 04/27/2024 MRN: [id number] Attending MD: Oliva Boots , MD, [id number] Date of Birth: 06-11-1960 CSN: [id number] Age: 63 Admit Type: Outpatient Procedure:                Upper GI endoscopy Indications:              Suspected ingestion of foreign body, Foreign body                            in the esophagus Providers:                Oliva Boots, MD, Olam Riedel, RN, Haskel Chris,                            Technician Referring MD:              Medicines:                General Anesthesia Complications:            No immediate complications. Estimated Blood Loss:     Estimated blood loss: none. Procedure:                Pre-Anesthesia Assessment:                           - Prior to the procedure, a History and Physical                            was performed, and patient medications and                            allergies were reviewed. The patient's tolerance of                            previous anesthesia was also reviewed. The risks                            and benefits of the procedure and the sedation                            options and risks were discussed with the patient.                            All questions were answered, and informed consent                            was obtained. Prior Anticoagulants: The patient has                            taken no anticoagulant or antiplatelet agents                            except for aspirin. ASA Grade Assessment: II - A  patient with mild systemic disease. After reviewing                            the risks and benefits, the patient was deemed in                            satisfactory condition to undergo the procedure.                           After obtaining informed consent, the endoscope was                            passed under direct vision. Throughout the                            procedure, the patient's  blood pressure, pulse, and                            oxygen saturations were monitored continuously. The                            [id number]-[id number] ([id number]) Olympus endoscope was introduced                            through the mouth, and advanced to the second part                            of duodenum. The upper GI endoscopy was                            accomplished without difficulty. The patient                            tolerated the procedure well. Scope In: Scope Out: Findings:      Food was found in the lower third of the esophagus. Removal was       accomplished with a Raptor grasping device and Roth net.      A medium amount of food (residue) was found in the cardia, in the       gastric fundus and in the gastric body.      The duodenal bulb, first portion of the duodenum and second portion of       the duodenum were normal.      LA Grade B (one or more mucosal breaks greater than 5 mm, not extending       between the tops of two mucosal folds) esophagitis with no bleeding was       found.      The exam was otherwise without abnormality. Impression:               - Food in the lower third of the esophagus. Removal                            was successful.                           -  A medium amount of food (residue) in the stomach.                           - Normal duodenal bulb, first portion of the                            duodenum and second portion of the duodenum.                           - LA Grade B reflux and erosive esophagitis with no                            bleeding.                           - The examination was otherwise normal. Moderate Sedation:      Not Applicable - Patient had care per Anesthesia. Recommendation:           - Patient has a contact number available for                            emergencies. The signs and symptoms of potential                            delayed complications were discussed with the                            patient.  Return to normal activities tomorrow.                            Written discharge instructions were provided to the                            patient.                           - Clear liquid diet for 12 hours. If doing well at                            noon tomorrow may resume his previous diet                           - Continue present medications.                           - Use Protonix (pantoprazole) 40 mg PO daily                            indefinitely.                           - Return to GI clinic PRN.                           - Telephone GI clinic if symptomatic PRN. Procedure Code(s):        ---  Professional ---                           [phone], Esophagogastroduodenoscopy, flexible,                            transoral; with removal of foreign body(s) Diagnosis Code(s):        --- Professional ---                           U81.871J, Food in esophagus causing other injury,                            initial encounter                           T18.2XXA, Foreign body in stomach, initial encounter                           K21.00, Gastro-esophageal reflux disease with                            esophagitis, without bleeding                           K20.80, Other esophagitis without bleeding                           T18.108A, Unspecified foreign body in esophagus                            causing other injury, initial encounter CPT copyright 2022 [employer] Association. All rights reserved. The codes documented in this report are preliminary and upon coder review may  be revised to meet current compliance requirements. Oliva Boots, MD 04/28/2024 12:33:22 AM This report has been signed electronically. Number of Addenda: 0

## 2024-04-28 NOTE — Anesthesia Procedure Notes (Signed)
 Procedure Name: Intubation Date/Time: 04/28/2024 12:10 AM  Performed by: Claudene Hoy CROME, CRNAPre-anesthesia Checklist: Patient identified, Emergency Drugs available, Suction available and Patient being monitored Patient Re-evaluated:Patient Re-evaluated prior to induction Oxygen Delivery Method: Circle System Utilized Preoxygenation: Pre-oxygenation with 100% oxygen Induction Type: IV induction Ventilation: Mask ventilation without difficulty Laryngoscope Size: Mac and 3 Grade View: Grade I Tube type: Oral Tube size: 7.0 mm Number of attempts: 1 Airway Equipment and Method: Stylet and Oral airway Placement Confirmation: ETT inserted through vocal cords under direct vision, positive ETCO2 and breath sounds checked- equal and bilateral Secured at: 22 cm Tube secured with: Tape Dental Injury: Teeth and Oropharynx as per pre-operative assessment

## 2024-04-29 NOTE — Anesthesia Postprocedure Evaluation (Signed)
 Anesthesia Post Note  Patient: [name]  Procedure(s) Performed: EGD (ESOPHAGOGASTRODUODENOSCOPY)     Patient location during evaluation: PACU Anesthesia Type: General Level of consciousness: awake and alert Pain management: pain level controlled Vital Signs Assessment: post-procedure vital signs reviewed and stable Respiratory status: spontaneous breathing, nonlabored ventilation, respiratory function stable and patient connected to nasal cannula oxygen Cardiovascular status: blood pressure returned to baseline and stable Postop Assessment: no apparent nausea or vomiting Anesthetic complications: no   No notable events documented.  Last Vitals:  Vitals:   04/28/24 0039 04/28/24 0045  BP: 120/82 (!) 129/107  Pulse: 85 94  Resp: 16 19  Temp:    SpO2: 97% 96%    Last Pain:  Vitals:   04/28/24 0305  TempSrc:   PainSc: 0-No pain   Pain Goal:                   [name]

## 2024-04-30 ENCOUNTER — Other Ambulatory Visit (HOSPITAL_COMMUNITY): Payer: Self-pay | Admitting: Family Medicine

## 2024-04-30 ENCOUNTER — Encounter (HOSPITAL_COMMUNITY): Payer: Self-pay | Admitting: Gastroenterology

## 2024-04-30 DIAGNOSIS — R1310 Dysphagia, unspecified: Secondary | ICD-10-CM

## 2024-04-30 DIAGNOSIS — R059 Cough, unspecified: Secondary | ICD-10-CM

## 2024-06-02 ENCOUNTER — Encounter (HOSPITAL_COMMUNITY)

## 2024-06-17 ENCOUNTER — Encounter (HOSPITAL_COMMUNITY): Payer: Self-pay

## 2024-06-17 ENCOUNTER — Encounter (HOSPITAL_COMMUNITY)

## 2024-06-24 ENCOUNTER — Telehealth (HOSPITAL_COMMUNITY): Payer: Self-pay

## 2024-06-24 NOTE — Telephone Encounter (Signed)
 Patient missed OP MBS (swallow study) appointment scheduled for 06/17/24. We attempted to contact group home to reschedule without luck. Please contact our acute rehab office at [phone] to reschedule.
